# Patient Record
Sex: Female | Born: 1980 | Hispanic: No | Marital: Married | Smoking: Never smoker
Health system: Southern US, Community
[De-identification: ages and names within clinical notes are randomized; demographics above are authoritative.]

## PROBLEM LIST (undated history)

## (undated) DIAGNOSIS — E079 Disorder of thyroid, unspecified: Secondary | ICD-10-CM

---

## 2020-09-30 ENCOUNTER — Inpatient Hospital Stay (HOSPITAL_COMMUNITY)
Admission: AD | Admit: 2020-09-30 | Discharge: 2020-09-30 | Disposition: A | Discharge status: Home / Self Care | Payer: Medicaid Other | Attending: Obstetrics and Gynecology | Admitting: Obstetrics and Gynecology

## 2020-09-30 ENCOUNTER — Other Ambulatory Visit: Payer: Self-pay

## 2020-09-30 ENCOUNTER — Inpatient Hospital Stay (HOSPITAL_COMMUNITY): Payer: Medicaid Other

## 2020-09-30 ENCOUNTER — Encounter (HOSPITAL_COMMUNITY): Payer: Self-pay | Admitting: Emergency Medicine

## 2020-09-30 DIAGNOSIS — R109 Unspecified abdominal pain: Secondary | ICD-10-CM

## 2020-09-30 DIAGNOSIS — O26891 Other specified pregnancy related conditions, first trimester: Secondary | ICD-10-CM | POA: Insufficient documentation

## 2020-09-30 DIAGNOSIS — O09521 Supervision of elderly multigravida, first trimester: Secondary | ICD-10-CM | POA: Insufficient documentation

## 2020-09-30 DIAGNOSIS — O26899 Other specified pregnancy related conditions, unspecified trimester: Secondary | ICD-10-CM

## 2020-09-30 DIAGNOSIS — Z3491 Encounter for supervision of normal pregnancy, unspecified, first trimester: Secondary | ICD-10-CM

## 2020-09-30 DIAGNOSIS — Z3A01 Less than 8 weeks gestation of pregnancy: Secondary | ICD-10-CM | POA: Insufficient documentation

## 2020-09-30 HISTORY — DX: Disorder of thyroid, unspecified: E07.9

## 2020-09-30 LAB — COMPREHENSIVE METABOLIC PANEL
ALT: 13 U/L (ref 0–44)
AST: 20 U/L (ref 15–41)
Albumin: 3.8 g/dL (ref 3.5–5.0)
Alkaline Phosphatase: 83 U/L (ref 38–126)
Anion gap: 9 (ref 5–15)
BUN: 8 mg/dL (ref 6–20)
CO2: 24 mmol/L (ref 22–32)
Calcium: 9 mg/dL (ref 8.9–10.3)
Chloride: 103 mmol/L (ref 98–111)
Creatinine, Ser: 0.52 mg/dL (ref 0.44–1.00)
GFR, Estimated: >60 mL/min (ref >60)
Glucose, Bld: 103 mg/dL — ABNORMAL HIGH (ref 70–99)
Potassium: 3.3 mmol/L — ABNORMAL LOW (ref 3.5–5.1)
Sodium: 136 mmol/L (ref 135–145)
Total Bilirubin: 0.3 mg/dL (ref 0.3–1.2)
Total Protein: 7.3 g/dL (ref 6.5–8.1)

## 2020-09-30 LAB — LIPASE, BLOOD: Lipase: 31 U/L (ref 11–51)

## 2020-09-30 LAB — URINALYSIS, ROUTINE W REFLEX MICROSCOPIC
Bilirubin Urine: NEGATIVE
Glucose, UA: NEGATIVE mg/dL
Ketones, ur: NEGATIVE mg/dL
Leukocytes,Ua: NEGATIVE
Nitrite: NEGATIVE
Protein, ur: NEGATIVE mg/dL
Specific Gravity, Urine: 1.014 (ref 1.005–1.030)
pH: 5 (ref 5.0–8.0)

## 2020-09-30 LAB — CBC
HCT: 37.1 % (ref 36.0–46.0)
Hemoglobin: 12 g/dL (ref 12.0–15.0)
MCH: 29.3 pg (ref 26.0–34.0)
MCHC: 32.3 g/dL (ref 30.0–36.0)
MCV: 90.5 fL (ref 80.0–100.0)
Platelets: 302 10*3/uL (ref 150–400)
RBC: 4.1 MIL/uL (ref 3.87–5.11)
RDW: 12.4 % (ref 11.5–15.5)
WBC: 5.7 10*3/uL (ref 4.0–10.5)
nRBC: 0 % (ref 0.0–0.2)

## 2020-09-30 LAB — I-STAT BETA HCG BLOOD, ED (MC, WL, AP ONLY): I-stat hCG, quantitative: >2000 m[IU]/mL — ABNORMAL HIGH (ref <5)

## 2020-09-30 LAB — HCG, QUANTITATIVE, PREGNANCY: hCG, Beta Chain, Quant, S: 72311 m[IU]/mL — ABNORMAL HIGH (ref <5)

## 2020-09-30 NOTE — Discharge Instructions (Signed)
Safe Medications in Pregnancy    Acne: Benzoyl Peroxide Salicylic Acid  Backache/Headache: Tylenol: 2 regular strength every 4 hours OR              2 Extra strength every 6 hours  Colds/Coughs/Allergies: Benadryl (alcohol free) 25 mg every 6 hours as needed Breath right strips Claritin Cepacol throat lozenges Chloraseptic throat spray Cold-Eeze- up to three times per day Cough drops, alcohol free Flonase (by prescription only) Guaifenesin Mucinex Robitussin DM (plain only, alcohol free) Saline nasal spray/drops Sudafed (pseudoephedrine) & Actifed ** use only after [redacted] weeks gestation and if you do not have high blood pressure Tylenol Vicks Vaporub Zinc lozenges Zyrtec   Constipation: Colace Ducolax suppositories Fleet enema Glycerin suppositories Metamucil Milk of magnesia Miralax Senokot Smooth move tea  Diarrhea: Kaopectate Imodium A-D  *NO pepto Bismol  Hemorrhoids: Anusol Anusol HC Preparation H Tucks  Indigestion: Tums Maalox Mylanta Zantac  Pepcid  Insomnia: Benadryl (alcohol free) 25mg every 6 hours as needed Tylenol PM Unisom, no Gelcaps  Leg Cramps: Tums MagGel  Nausea/Vomiting:  Bonine Dramamine Emetrol Ginger extract Sea bands Meclizine  Nausea medication to take during pregnancy:  Unisom (doxylamine succinate 25 mg tablets) Take one tablet daily at bedtime. If symptoms are not adequately controlled, the dose can be increased to a maximum recommended dose of two tablets daily (1/2 tablet in the morning, 1/2 tablet mid-afternoon and one at bedtime). Vitamin B6 100mg tablets. Take one tablet twice a day (up to 200 mg per day).  Skin Rashes: Aveeno products Benadryl cream or 25mg every 6 hours as needed Calamine Lotion 1% cortisone cream  Yeast infection: Gyne-lotrimin 7 Monistat 7   **If taking multiple medications, please check labels to avoid duplicating the same active ingredients **take  medication as directed on the label ** Do not exceed 4000 mg of tylenol in 24 hours **Do not take medications that contain aspirin or ibuprofen   Prenatal Care Providers           [facility]  [address] [phone]  [facility]   [address]  [phone]  [facility] [place]       [address] [phone]            [facility] [place]     [address] [phone]          [facility] [place]   [address] [phone]  [facility]  [address] [phone]     [facility]  [address]   [phone]     [facility]  Phone: [phone]  [facility]  Phone: [phone]  [facility] Phone: [phone]  [facility] Phone: [phone]  [facility] Phone: [phone]  [facility] Phone: [phone]  [facility]  Phone: [phone]  

## 2020-09-30 NOTE — Progress Notes (Signed)
Pt refused wet prep and G/C swab.

## 2020-09-30 NOTE — MAU Provider Note (Signed)
History     CSN: [id number]  Arrival date and time: 09/30/20 4585   Event Date/Time   First Provider Initiated Contact with Patient 09/30/20 2208      Chief Complaint  Patient presents with  . Abdominal Pain   HPI Christine Page is a 40 y.o. G3P2000 at [redacted]w[redacted]d who presents from [facility] for abdominal pain. She reports it has been going on for months but has gotten worse since she got pregnant. She reports the pain is mostly on the right but is also on the left. She reports it comes and goes. She rates the pain a 6/10 and has not tried anything for the pain. She denies any vaginal bleeding or discharge.   OB History    Gravida  3   Para  2   Term  2   Preterm      AB      Living        SAB      IAB      Ectopic      Multiple      Live Births              Past Medical History:  Diagnosis Date  . Thyroid disease     Past Surgical History:  Procedure Laterality Date  . CESAREAN SECTION      No family history on file.  Social History   Tobacco Use  . Smoking status: Never Smoker  . Smokeless tobacco: Never Used  Substance Use Topics  . Alcohol use: Never  . Drug use: Never    Allergies: No Known Allergies  No medications prior to admission.    Review of Systems  Constitutional: Negative.  Negative for fatigue and fever.  HENT: Negative.   Respiratory: Negative.  Negative for shortness of breath.   Cardiovascular: Negative.  Negative for chest pain.  Gastrointestinal: Positive for abdominal pain. Negative for constipation, diarrhea, nausea and vomiting.  Genitourinary: Negative.  Negative for dysuria, vaginal bleeding and vaginal discharge.  Neurological: Negative.  Negative for dizziness and headaches.   Physical Exam   Blood pressure 120/74, pulse 75, temperature 98.6 F (37 C), resp. rate 16, height 5' 1.42" (1.56 m), last menstrual period 08/15/2020, SpO2 100 %.  Physical Exam Vitals and nursing note reviewed.   Constitutional:      General: She is not in acute distress.    Appearance: She is well-developed.  HENT:     Head: Normocephalic.  Eyes:     Pupils: Pupils are equal, round, and reactive to light.  Cardiovascular:     Rate and Rhythm: Normal rate and regular rhythm.     Heart sounds: Normal heart sounds.  Pulmonary:     Effort: Pulmonary effort is normal. No respiratory distress.     Breath sounds: Normal breath sounds.  Abdominal:     General: Bowel sounds are normal. There is no distension.     Palpations: Abdomen is soft.     Tenderness: There is no abdominal tenderness.  Skin:    General: Skin is warm and dry.  Neurological:     Mental Status: She is alert and oriented to person, place, and time.  Psychiatric:        Behavior: Behavior normal.        Thought Content: Thought content normal.        Judgment: Judgment normal.     MAU Course  Procedures Results for orders placed or performed during the hospital encounter of  09/30/20 (from the past 24 hour(s))  Lipase, blood     Status: None   Collection Time: 09/30/20  8:27 PM  Result Value Ref Range   Lipase 31 11 - 51 U/L  Comprehensive metabolic panel     Status: Abnormal   Collection Time: 09/30/20  8:27 PM  Result Value Ref Range   Sodium 136 135 - 145 mmol/L   Potassium 3.3 (L) 3.5 - 5.1 mmol/L   Chloride 103 98 - 111 mmol/L   CO2 24 22 - 32 mmol/L   Glucose, Bld 103 (H) 70 - 99 mg/dL   BUN 8 6 - 20 mg/dL   Creatinine, Ser 4.74 0.44 - 1.00 mg/dL   Calcium 9.0 8.9 - 25.9 mg/dL   Total Protein 7.3 6.5 - 8.1 g/dL   Albumin 3.8 3.5 - 5.0 g/dL   AST 20 15 - 41 U/L   ALT 13 0 - 44 U/L   Alkaline Phosphatase 83 38 - 126 U/L   Total Bilirubin 0.3 0.3 - 1.2 mg/dL   GFR, Estimated >56 >38 mL/min   Anion gap 9 5 - 15  CBC     Status: None   Collection Time: 09/30/20  8:27 PM  Result Value Ref Range   WBC 5.7 4.0 - 10.5 K/uL   RBC 4.10 3.87 - 5.11 MIL/uL   Hemoglobin 12.0 12.0 - 15.0 g/dL   HCT 75.6 43.3 -  29.5 %   MCV 90.5 80.0 - 100.0 fL   MCH 29.3 26.0 - 34.0 pg   MCHC 32.3 30.0 - 36.0 g/dL   RDW 18.8 41.6 - 60.6 %   Platelets 302 150 - 400 K/uL   nRBC 0.0 0.0 - 0.2 %  Urinalysis, Routine w reflex microscopic Urine, Clean Catch     Status: Abnormal   Collection Time: 09/30/20  8:27 PM  Result Value Ref Range   Color, Urine YELLOW YELLOW   APPearance HAZY (A) CLEAR   Specific Gravity, Urine 1.014 1.005 - 1.030   pH 5.0 5.0 - 8.0   Glucose, UA NEGATIVE NEGATIVE mg/dL   Hgb urine dipstick SMALL (A) NEGATIVE   Bilirubin Urine NEGATIVE NEGATIVE   Ketones, ur NEGATIVE NEGATIVE mg/dL   Protein, ur NEGATIVE NEGATIVE mg/dL   Nitrite NEGATIVE NEGATIVE   Leukocytes,Ua NEGATIVE NEGATIVE   RBC / HPF 0-5 0 - 5 RBC/hpf   WBC, UA 0-5 0 - 5 WBC/hpf   Bacteria, UA RARE (A) NONE SEEN   Squamous Epithelial / LPF 11-20 0 - 5   Mucus PRESENT   I-Stat beta hCG blood, ED     Status: Abnormal   Collection Time: 09/30/20  8:57 PM  Result Value Ref Range   I-stat hCG, quantitative >2,000.0 (H) <5 mIU/mL   Comment 3          hCG, quantitative, pregnancy     Status: Abnormal   Collection Time: 09/30/20 10:06 PM  Result Value Ref Range   hCG, Beta Chain, Quant, S 72,311 (H) <5 mIU/mL  ABO/Rh     Status: None   Collection Time: 09/30/20 10:06 PM  Result Value Ref Range   ABO/RH(D)      AB POS Performed at [facility], [address] [place], [place] [address]    US OB LESS THAN 14 WEEKS WITH OB TRANSVAGINAL  Result Date: 09/30/2020 CLINICAL DATA:  Abdominal pain EXAM: OBSTETRIC <14 WK [place] AND TRANSVAGINAL OB US TECHNIQUE: Both transabdominal and transvaginal ultrasound examinations were performed for  complete evaluation of the gestation as well as the maternal uterus, adnexal regions, and pelvic cul-de-sac. Transvaginal technique was performed to assess early pregnancy. COMPARISON:  None. FINDINGS: Intrauterine gestational sac: Single Yolk sac:  Visualized. Embryo:  Visualized. Cardiac  Activity: Visualized. Heart Rate:   bpm MSD:   mm    w     d CRL:  2.2 mm   too small to date Subchorionic hemorrhage:  None visualized. Maternal uterus/adnexae: No adnexal mass or free fluid. IMPRESSION: Early intrauterine gestation with small fetal pole, too small to date at this time. This could be followed with repeat ultrasound in 14 days to ensure expected progression. No acute maternal findings. Electronically Signed   By: Charlett Nose M.D.   On: 09/30/2020 22:56   MDM UA, UPT CBC, HCG ABO/Rh- AB Pos Wet prep and gc/chlamydia- patient declined US OB Comp Less 14 weeks with Transvaginal  Assessment and Plan   1. Normal intrauterine pregnancy on prenatal ultrasound in first trimester   2. Abdominal pain affecting pregnancy   3. [redacted] weeks gestation of pregnancy    -Discharge home in stable condition -Repeat ultrasound ordered for 2 weeks -First trimester precautions discussed -Patient advised to follow-up with OB to establish prenatal care -Patient may return to MAU as needed or if her condition were to change or worsen   Rolm Bookbinder CNM 09/30/2020, 10:08 PM

## 2020-09-30 NOTE — ED Provider Notes (Signed)
Emergency Medicine Provider Triage Evaluation Note  Christine Page , a 40 y.o. female  was evaluated in triage.  Pt complains of diffuse lower abdominal pain for the past 3 months however more consistent over the past 2 days. She reports LNMP 04/16 and she had a positive pregnancy test at an UC. She does not have a copy of those results. She also complains of nausea; no vomiting. No urinary symptoms. No vaginal bleeding or discharge. She has 2 children at home; delivery by c section. No complications. No fevers or chills.   Review of Systems  Positive: + abdominal pain,  nausea Negative: - fevers, chills, diarrhea  Physical Exam  BP 120/74 (BP Location: Right Arm)   Pulse 75   Temp 98.6 F (37 C)   Resp 16   Ht 5' 1.42" (1.56 m)   LMP 08/15/2020   SpO2 100%  Gen:   Awake, no distress   Resp:  Normal effort  MSK:   Moves extremities without difficulty  Other:  + RLQ and LLQ abdominal TTP  Medical Decision Making  Medically screening exam initiated at 8:39 PM.  Appropriate orders placed.  Christine Page was informed that the remainder of the evaluation will be completed by another provider, this initial triage assessment does not replace that evaluation, and the importance of remaining in the ED until their evaluation is complete.  Pregnancy test positive. Discussed with MAU provider Christine Page who agrees to accept.    Tanda Rockers, PA-C 09/30/20 2112    Mancel Bale, MD 09/30/20 [phone]

## 2020-09-30 NOTE — ED Triage Notes (Signed)
Pt reports RLQ pain that has been going on for 3 months and has gotten worse the last two days. Pt reports nausea, denies v/d. Pt is [redacted] weeks pregnant.

## 2020-09-30 NOTE — MAU Note (Signed)
Pt reports lower right and left sided pain, 7/10, hurts worse when walking or changing positions. Right side has been hurting for about 2 months and left side started 1 month ago, Right side hurts worse. Denies VB.

## 2020-10-01 LAB — ABO/RH: ABO/RH(D): AB POS

## 2020-10-14 ENCOUNTER — Ambulatory Visit
Admission: RE | Admit: 2020-10-14 | Discharge: 2020-10-14 | Disposition: A | Discharge status: Home / Self Care | Payer: Medicaid Other | Source: Ambulatory Visit

## 2020-10-14 ENCOUNTER — Ambulatory Visit (INDEPENDENT_AMBULATORY_CARE_PROVIDER_SITE_OTHER): Payer: Medicaid Other | Admitting: *Deleted

## 2020-10-14 ENCOUNTER — Other Ambulatory Visit: Payer: Self-pay

## 2020-10-14 VITALS — BP 103/67 | HR 59 | Ht 62.5 in | Wt 156.6 lb

## 2020-10-14 DIAGNOSIS — O3680X0 Pregnancy with inconclusive fetal viability, not applicable or unspecified: Secondary | ICD-10-CM

## 2020-10-14 DIAGNOSIS — Z3491 Encounter for supervision of normal pregnancy, unspecified, first trimester: Secondary | ICD-10-CM | POA: Diagnosis not present

## 2020-10-14 DIAGNOSIS — Z3A01 Less than 8 weeks gestation of pregnancy: Secondary | ICD-10-CM | POA: Insufficient documentation

## 2020-10-14 NOTE — Progress Notes (Signed)
Chart reviewed for nurse visit. Agree with plan of care.   Venora Maples, MD 10/14/20 2:27 PM

## 2020-10-14 NOTE — Progress Notes (Signed)
Here for [place] results. Reviewed with Dr. Crissie Reese and advised US shows live baby measuring  [redacted]w[redacted]d with EDD of 05/27/21 which is different than EDD from period. We discussed is noted LMP 4/ 29 or 4/15. She states around 08/15/20 within a few days. Explained MD states EDD should be based on [place] not LMP. Advised to start prenatal care with provider of her choice. List placed in AVS. She states she goes to [facility] and they will assign prenatal provider. Advised to start PNV. Advised if severe pain to go to hospital for evaluation . She voices understanding. Juanito Gonyer,RN

## 2020-10-14 NOTE — Patient Instructions (Signed)
Prenatal Care Providers           [facility]  [address] [phone]  [facility]   [address]  [phone]  [facility]       [address] [phone]            [facility] [place]     [address] [phone]          [facility] [place]   [address] [phone]  [facility]  [address] [phone]     [facility]  [address]   [phone]     [facility]  Phone: [phone]  [facility]  Phone: [phone]  [facility] Phone: [phone]  [facility] Phone: [phone]  [facility] Phone: [phone]  [facility] Phone: [phone]  [facility]  Phone: [phone]  

## 2020-12-25 ENCOUNTER — Ambulatory Visit (INDEPENDENT_AMBULATORY_CARE_PROVIDER_SITE_OTHER): Payer: Medicaid Other | Admitting: Obstetrics and Gynecology

## 2020-12-25 ENCOUNTER — Other Ambulatory Visit: Payer: Self-pay

## 2020-12-25 ENCOUNTER — Encounter: Payer: Self-pay | Admitting: Obstetrics and Gynecology

## 2020-12-25 ENCOUNTER — Other Ambulatory Visit (HOSPITAL_COMMUNITY)
Admission: RE | Admit: 2020-12-25 | Discharge: 2020-12-25 | Disposition: A | Discharge status: Home / Self Care | Payer: Medicaid Other | Source: Ambulatory Visit | Attending: Obstetrics and Gynecology | Admitting: Obstetrics and Gynecology

## 2020-12-25 DIAGNOSIS — O0990 Supervision of high risk pregnancy, unspecified, unspecified trimester: Secondary | ICD-10-CM | POA: Diagnosis present

## 2020-12-25 DIAGNOSIS — N841 Polyp of cervix uteri: Secondary | ICD-10-CM | POA: Insufficient documentation

## 2020-12-25 DIAGNOSIS — Z3A18 18 weeks gestation of pregnancy: Secondary | ICD-10-CM | POA: Insufficient documentation

## 2020-12-25 DIAGNOSIS — Z3480 Encounter for supervision of other normal pregnancy, unspecified trimester: Secondary | ICD-10-CM

## 2020-12-25 DIAGNOSIS — O09522 Supervision of elderly multigravida, second trimester: Secondary | ICD-10-CM | POA: Insufficient documentation

## 2020-12-25 DIAGNOSIS — Z98891 History of uterine scar from previous surgery: Secondary | ICD-10-CM | POA: Insufficient documentation

## 2020-12-25 LAB — OB RESULTS CONSOLE GC/CHLAMYDIA: Gonorrhea: NEGATIVE

## 2020-12-25 MED ORDER — BLOOD PRESSURE KIT DEVI: 1.0000 | 0 refills | Status: DC | PRN

## 2020-12-25 NOTE — Progress Notes (Signed)
INITIAL PRENATAL VISIT NOTE  Subjective:  Christine Page is a 40 y.o. P3A2505 at [redacted]w[redacted]d by ultrasound being seen today for her initial prenatal visit. She has an obstetric history significant for c section x 2. She has a medical history significant for thyroid disease and goiter.  Patient reports no complaints.  Contractions: Not present. Vag. Bleeding: None.  Movement: Present. Denies leaking of fluid.    Past Medical History:  Diagnosis Date   Thyroid disease     Past Surgical History:  Procedure Laterality Date   CESAREAN SECTION      OB History  Gravida Para Term Preterm AB Living  4 2 2   1 2   SAB IAB Ectopic Multiple Live Births  1       2    # Outcome Date GA Lbr Len/2nd Weight Sex Delivery Anes PTL Lv  4 Current           3 Term 10/04/18 [redacted]w[redacted]d   F CS-LTranv Spinal N LIV     Complications: Chorioamnionitis, Failure to Progress in First Stage  2 Term 08/21/14 [redacted]w[redacted]d  8 lb 2.5 oz (3.7 kg) M CS-LTranv Spinal N LIV     Complications: Fetal Intolerance  1 SAB             Social History   Socioeconomic History   Marital status: Single    Spouse name: Not on file   Number of children: Not on file   Years of education: Not on file   Highest education level: Not on file  Occupational History   Not on file  Tobacco Use   Smoking status: Never   Smokeless tobacco: Never  Substance and Sexual Activity   Alcohol use: Never   Drug use: Never   Sexual activity: Not on file  Other Topics Concern   Not on file  Social History Narrative   Not on file   Social Determinants of Health   Financial Resource Strain: Not on file  Food Insecurity: Not on file  Transportation Needs: Not on file  Physical Activity: Not on file  Stress: Not on file  Social Connections: Not on file    No family history on file.  No current outpatient medications on file.  No Known Allergies  Review of Systems: Negative except for what is mentioned in HPI.  Objective:    Vitals:   12/25/20 1016  BP: 97/60  Pulse: 79  Weight: 152 lb (68.9 kg)    Fetal Status: Fetal Heart Rate (bpm): 153   Movement: Present     Physical Exam: BP 97/60   Pulse 79   Wt 152 lb (68.9 kg)   LMP 08/15/2020 (Within Days)   BMI 27.36 kg/m  CONSTITUTIONAL: Well-developed, well-nourished female in no acute distress.  NEUROLOGIC: Alert and oriented to person, place, and time. Normal reflexes, muscle tone coordination. No cranial nerve deficit noted. PSYCHIATRIC: Normal mood and affect. Normal behavior. Normal judgment and thought content. SKIN: Skin is warm and dry. No rash noted. Not diaphoretic. No erythema. No pallor. HENT:  Normocephalic, atraumatic, External right and left ear normal. Oropharynx is clear and moist EYES: Conjunctivae and EOM are normal. Pt has a cystic lesion near her right iris NECK: Normal range of motion, supple, no masses CARDIOVASCULAR: Normal heart rate noted, regular rhythm RESPIRATORY: Effort and breath sounds normal, no problems with respiration noted BREASTS: symmetric, non-tender, no masses palpable, exam performed with chaperone present ABDOMEN: Soft, nontender, nondistended, gravid. GU: normal appearing  external female genitalia, nulliparous appearing cervix with cervical polyp noted through the os, scant white discharge in vagina, no lesions noted Bimanual: 18 weeks sized uterus, no adnexal tenderness or palpable lesions noted MUSCULOSKELETAL: Normal range of motion. EXT:  No edema and no tenderness. 2+ distal pulses.   Assessment and Plan:  Pregnancy: [id number] at [redacted]w[redacted]d by ultrasound  1. Supervision of high risk pregnancy, antepartum Continue fouritne care, anatomy scan ordered - Cytology - PAP - Culture, OB Urine - CBC/D/Plt+RPR+Rh+ABO+RubIgG... - Cervicovaginal ancillary only( [place]) - AFP, Serum, Open Spina Bifida - Genetic Screening - CH[facility] AMB BABYSCRIPTS OPT IN - [place] MFM OB DETAIL +14 WK; Future  2. [redacted] weeks gestation  of pregnancy   3. Advanced maternal age in multigravida, second trimester   4. History of 2 cesarean sections Did not discuss delivery route at this visit  5. Cervical polyp    Preterm labor symptoms and general obstetric precautions including but not limited to vaginal bleeding, contractions, leaking of fluid and fetal movement were reviewed in detail with the patient.  Please refer to After Visit Summary for other counseling recommendations.   Return in about 4 weeks (around 01/22/2021) for [facility], in person.  Warden Fillers 12/25/2020 11:05 AM

## 2020-12-25 NOTE — Progress Notes (Signed)
Pt presents today for initial OB visit. PHQ 9: 4. Pt has no complaints today.

## 2020-12-26 LAB — CYTOLOGY - PAP
Comment: NEGATIVE
Diagnosis: NEGATIVE
High risk HPV: NEGATIVE

## 2020-12-26 LAB — CERVICOVAGINAL ANCILLARY ONLY
Chlamydia: NEGATIVE
Comment: NEGATIVE
Comment: NEGATIVE
Comment: NORMAL
Neisseria Gonorrhea: NEGATIVE
Trichomonas: NEGATIVE

## 2020-12-26 LAB — TSH: TSH: 5.83 u[IU]/mL — ABNORMAL HIGH (ref 0.450–4.500)

## 2020-12-27 LAB — CBC/D/PLT+RPR+RH+ABO+RUBIGG...
Antibody Screen: NEGATIVE
Basophils Absolute: 0 10*3/uL (ref 0.0–0.2)
Basos: 0 %
EOS (ABSOLUTE): 0 10*3/uL (ref 0.0–0.4)
Eos: 1 %
HCV Ab: <0.1 s/co ratio (ref 0.0–0.9)
HIV Screen 4th Generation wRfx: NONREACTIVE
Hematocrit: 34.3 % (ref 34.0–46.6)
Hemoglobin: 11.7 g/dL (ref 11.1–15.9)
Hepatitis B Surface Ag: NEGATIVE
Immature Grans (Abs): 0 10*3/uL (ref 0.0–0.1)
Immature Granulocytes: 0 %
Lymphocytes Absolute: 1.1 10*3/uL (ref 0.7–3.1)
Lymphs: 30 %
MCH: 30.4 pg (ref 26.6–33.0)
MCHC: 34.1 g/dL (ref 31.5–35.7)
MCV: 89 fL (ref 79–97)
Monocytes Absolute: 0.3 10*3/uL (ref 0.1–0.9)
Monocytes: 7 %
Neutrophils Absolute: 2.4 10*3/uL (ref 1.4–7.0)
Neutrophils: 62 %
Platelets: 266 10*3/uL (ref 150–450)
RBC: 3.85 x10E6/uL (ref 3.77–5.28)
RDW: 13.1 % (ref 11.7–15.4)
RPR Ser Ql: NONREACTIVE
Rh Factor: POSITIVE
Rubella Antibodies, IGG: 12 index (ref >0.99)
WBC: 3.7 10*3/uL (ref 3.4–10.8)

## 2020-12-27 LAB — AFP, SERUM, OPEN SPINA BIFIDA
AFP MoM: 1.5
AFP Value: 68.4 ng/mL
Gest. Age on Collection Date: 18.1 weeks
Maternal Age At EDD: 40.6 yr
OSBR Risk 1 IN: 2734
Test Results:: NEGATIVE
Weight: 152 [lb_av]

## 2020-12-27 LAB — HCV INTERPRETATION

## 2020-12-29 ENCOUNTER — Other Ambulatory Visit: Payer: Self-pay | Admitting: Obstetrics and Gynecology

## 2020-12-29 LAB — CULTURE, OB URINE

## 2020-12-29 LAB — URINE CULTURE, OB REFLEX

## 2020-12-29 MED ORDER — NITROFURANTOIN MONOHYD MACRO 100 MG PO CAPS: 100.0000 mg | ORAL_CAPSULE | Freq: Two times a day (BID) | ORAL | 1 refills | Status: DC

## 2020-12-30 NOTE — Progress Notes (Signed)
Called patient using [employer]. Patient notified of concern for hypothyroidism and need for lab appointment to check T4. Patient requests call to schedule appointment for lab only visit. Please use the interpreter service.

## 2021-01-01 ENCOUNTER — Encounter: Payer: Self-pay | Admitting: Obstetrics and Gynecology

## 2021-01-06 ENCOUNTER — Encounter: Payer: Self-pay | Admitting: Obstetrics and Gynecology

## 2021-01-08 ENCOUNTER — Other Ambulatory Visit: Payer: Self-pay

## 2021-01-08 ENCOUNTER — Other Ambulatory Visit: Payer: Medicaid Other

## 2021-01-08 DIAGNOSIS — O0990 Supervision of high risk pregnancy, unspecified, unspecified trimester: Secondary | ICD-10-CM

## 2021-01-09 LAB — T4, FREE: Free T4: 0.7 ng/dL — ABNORMAL LOW (ref 0.82–1.77)

## 2021-01-14 ENCOUNTER — Other Ambulatory Visit: Payer: Self-pay | Admitting: *Deleted

## 2021-01-14 ENCOUNTER — Encounter: Payer: Self-pay | Admitting: *Deleted

## 2021-01-14 DIAGNOSIS — E039 Hypothyroidism, unspecified: Secondary | ICD-10-CM

## 2021-01-14 MED ORDER — PREPLUS 27-1 MG PO TABS: 1.0000 | ORAL_TABLET | Freq: Every day | ORAL | 13 refills | Status: DC

## 2021-01-14 MED ORDER — LEVOTHYROXINE SODIUM 112 MCG PO TABS: 112.0000 ug | ORAL_TABLET | Freq: Every day | ORAL | 6 refills | Status: DC

## 2021-01-14 NOTE — Progress Notes (Signed)
TC to patient. Notified of test results and recommendation for Synthroid. RX for Synthroid sent per verbal order Dr. Donavan Foil. Educational information sent to [employer].  Patient also requested PNV. RX sent.

## 2021-01-22 ENCOUNTER — Ambulatory Visit: Payer: Medicaid Other | Admitting: *Deleted

## 2021-01-22 ENCOUNTER — Encounter: Payer: Self-pay | Admitting: Obstetrics & Gynecology

## 2021-01-22 ENCOUNTER — Encounter: Payer: Self-pay | Admitting: *Deleted

## 2021-01-22 ENCOUNTER — Other Ambulatory Visit: Payer: Self-pay

## 2021-01-22 ENCOUNTER — Ambulatory Visit (INDEPENDENT_AMBULATORY_CARE_PROVIDER_SITE_OTHER): Payer: Medicaid Other | Admitting: Obstetrics & Gynecology

## 2021-01-22 ENCOUNTER — Ambulatory Visit: Payer: Medicaid Other | Attending: Obstetrics and Gynecology

## 2021-01-22 VITALS — BP 102/69 | HR 74 | Wt 153.0 lb

## 2021-01-22 VITALS — BP 94/53 | HR 85

## 2021-01-22 DIAGNOSIS — Z98891 History of uterine scar from previous surgery: Secondary | ICD-10-CM

## 2021-01-22 DIAGNOSIS — O0990 Supervision of high risk pregnancy, unspecified, unspecified trimester: Secondary | ICD-10-CM | POA: Insufficient documentation

## 2021-01-22 NOTE — Progress Notes (Signed)
   PRENATAL VISIT NOTE  Subjective:  Christine Page is a 40 y.o. [phone] at [redacted]w[redacted]d being seen today for ongoing prenatal care.  She is currently monitored for the following issues for this high-risk pregnancy and has Supervision of high risk pregnancy, antepartum; Advanced maternal age in multigravida, second trimester; History of 2 cesarean sections; and Cervical polyp on their problem list.  Patient reports no complaints.  Contractions: Not present.  .  Movement: Present. Denies leaking of fluid.   The following portions of the patient's history were reviewed and updated as appropriate: allergies, current medications, past family history, past medical history, past social history, past surgical history and problem list.   Objective:   Vitals:   01/22/21 1533  BP: 102/69  Pulse: 74  Weight: 153 lb (69.4 kg)    Fetal Status: Fetal Heart Rate (bpm): 152   Movement: Present     General:  Alert, oriented and cooperative. Patient is in no acute distress.  Skin: Skin is warm and dry. No rash noted.   Cardiovascular: Normal heart rate noted  Respiratory: Normal respiratory effort, no problems with respiration noted  Abdomen: Soft, gravid, appropriate for gestational age.  Pain/Pressure: Absent     Pelvic: Cervical exam deferred        Extremities: Normal range of motion.  Edema: None  Mental Status: Normal mood and affect. Normal behavior. Normal judgment and thought content.   Assessment and Plan:  Pregnancy: [id number] at [redacted]w[redacted]d 1. Supervision of high risk pregnancy, antepartum Recommended Covid Vaccine in pregnancy  2. History of 2 cesarean sections Plan repeat 39 weeks  Preterm labor symptoms and general obstetric precautions including but not limited to vaginal bleeding, contractions, leaking of fluid and fetal movement were reviewed in detail with the patient. Please refer to After Visit Summary for other counseling recommendations.   Return in about 5 weeks (around  02/26/2021).  Future Appointments  Date Time Provider Department Center  03/05/2021  2:30 PM [facility] NURSE [facility]  03/05/2021  2:45 PM [facility] US5 [facility]    Scheryl Darter, MD

## 2021-01-22 NOTE — Progress Notes (Signed)
ROB [redacted]w[redacted]d  CC: None

## 2021-01-23 ENCOUNTER — Other Ambulatory Visit: Payer: Self-pay | Admitting: *Deleted

## 2021-01-23 DIAGNOSIS — O09523 Supervision of elderly multigravida, third trimester: Secondary | ICD-10-CM

## 2021-02-26 ENCOUNTER — Other Ambulatory Visit: Payer: Medicaid Other

## 2021-02-26 ENCOUNTER — Encounter: Payer: Self-pay | Admitting: Obstetrics and Gynecology

## 2021-02-26 ENCOUNTER — Ambulatory Visit (INDEPENDENT_AMBULATORY_CARE_PROVIDER_SITE_OTHER): Payer: Medicaid Other | Admitting: Obstetrics and Gynecology

## 2021-02-26 ENCOUNTER — Other Ambulatory Visit: Payer: Self-pay

## 2021-02-26 VITALS — BP 96/52 | HR 70 | Wt 156.1 lb

## 2021-02-26 DIAGNOSIS — Z98891 History of uterine scar from previous surgery: Secondary | ICD-10-CM

## 2021-02-26 DIAGNOSIS — Z3A27 27 weeks gestation of pregnancy: Secondary | ICD-10-CM

## 2021-02-26 DIAGNOSIS — O09522 Supervision of elderly multigravida, second trimester: Secondary | ICD-10-CM

## 2021-02-26 DIAGNOSIS — O0990 Supervision of high risk pregnancy, unspecified, unspecified trimester: Secondary | ICD-10-CM

## 2021-02-26 NOTE — Progress Notes (Signed)
Pt presents for ROB and 2 gtt labs.  Pt declines Tdap, Flu, and Covid vaccines. GAD7=0 PHQ9= 0

## 2021-02-26 NOTE — Progress Notes (Signed)
   PRENATAL VISIT NOTE  Subjective:  Christine Page is a 40 y.o. [phone] at [redacted]w[redacted]d being seen today for ongoing prenatal care.  She is currently monitored for the following issues for this high-risk pregnancy and has Supervision of high risk pregnancy, antepartum; Advanced maternal age in multigravida, second trimester; History of 2 cesarean sections; and Cervical polyp on their problem list.  Patient reports no complaints.  Contractions: Not present. Vag. Bleeding: None.  Movement: Present. Denies leaking of fluid.   The following portions of the patient's history were reviewed and updated as appropriate: allergies, current medications, past family history, past medical history, past social history, past surgical history and problem list.   Objective:   Vitals:   02/26/21 0830  BP: (!) 96/52  Pulse: 70  Weight: 156 lb 1.6 oz (70.8 kg)    Fetal Status: Fetal Heart Rate (bpm): 149   Movement: Present     General:  Alert, oriented and cooperative. Patient is in no acute distress.  Skin: Skin is warm and dry. No rash noted.   Cardiovascular: Normal heart rate noted  Respiratory: Normal respiratory effort, no problems with respiration noted  Abdomen: Soft, gravid, appropriate for gestational age.  Pain/Pressure: Absent     Pelvic: Cervical exam deferred        Extremities: Normal range of motion.  Edema: None  Mental Status: Normal mood and affect. Normal behavior. Normal judgment and thought content.   Assessment and Plan:  Pregnancy: P6P9509 at [redacted]w[redacted]d 1. Supervision of high risk pregnancy, antepartum Patient is doing well without complaints Third trimester labs today Patient declined flu and tdap vaccines  2. [redacted] weeks gestation of pregnancy Patient undecided on contraception  3. History of 2 cesarean sections Will be scheduled for a repeat  4. Advanced maternal age in multigravida, second trimester Follow up growth ultrasound scheduled  Preterm labor symptoms and  general obstetric precautions including but not limited to vaginal bleeding, contractions, leaking of fluid and fetal movement were reviewed in detail with the patient. Please refer to After Visit Summary for other counseling recommendations.   Return in about 2 weeks (around 03/12/2021) for in person, ROB, High risk.  Future Appointments  Date Time Provider Department Center  02/26/2021  8:55 AM Theo Reither, Gigi Gin, MD [facility] None  03/05/2021  2:30 PM [facility] NURSE [facility]  03/05/2021  2:45 PM [facility] US5 [facility]    Catalina Antigua, MD

## 2021-02-27 LAB — GLUCOSE TOLERANCE, 2 HOURS W/ 1HR
Glucose, 1 hour: 146 mg/dL (ref 70–179)
Glucose, 2 hour: 95 mg/dL (ref 70–152)
Glucose, Fasting: 82 mg/dL (ref 70–91)

## 2021-02-27 LAB — CBC
Hematocrit: 35 % (ref 34.0–46.6)
Hemoglobin: 11.9 g/dL (ref 11.1–15.9)
MCH: 31 pg (ref 26.6–33.0)
MCHC: 34 g/dL (ref 31.5–35.7)
MCV: 91 fL (ref 79–97)
Platelets: 212 10*3/uL (ref 150–450)
RBC: 3.84 x10E6/uL (ref 3.77–5.28)
RDW: 12.5 % (ref 11.7–15.4)
WBC: 3.6 10*3/uL (ref 3.4–10.8)

## 2021-02-27 LAB — THYROID PANEL WITH TSH
Free Thyroxine Index: 1.7 (ref 1.2–4.9)
T3 Uptake Ratio: 15 % — ABNORMAL LOW (ref 24–39)
T4, Total: 11.3 ug/dL (ref 4.5–12.0)
TSH: 3.94 u[IU]/mL (ref 0.450–4.500)

## 2021-02-27 LAB — HIV ANTIBODY (ROUTINE TESTING W REFLEX): HIV Screen 4th Generation wRfx: NONREACTIVE

## 2021-02-27 LAB — RPR: RPR Ser Ql: NONREACTIVE

## 2021-03-05 ENCOUNTER — Encounter: Payer: Self-pay | Admitting: *Deleted

## 2021-03-05 ENCOUNTER — Other Ambulatory Visit: Payer: Self-pay | Admitting: *Deleted

## 2021-03-05 ENCOUNTER — Other Ambulatory Visit: Payer: Self-pay

## 2021-03-05 ENCOUNTER — Ambulatory Visit (HOSPITAL_BASED_OUTPATIENT_CLINIC_OR_DEPARTMENT_OTHER): Payer: Medicaid Other | Admitting: Maternal & Fetal Medicine

## 2021-03-05 ENCOUNTER — Ambulatory Visit: Payer: Medicaid Other | Admitting: *Deleted

## 2021-03-05 ENCOUNTER — Ambulatory Visit: Payer: Medicaid Other | Attending: Obstetrics

## 2021-03-05 VITALS — BP 103/62 | HR 78

## 2021-03-05 DIAGNOSIS — O99283 Endocrine, nutritional and metabolic diseases complicating pregnancy, third trimester: Secondary | ICD-10-CM

## 2021-03-05 DIAGNOSIS — O0990 Supervision of high risk pregnancy, unspecified, unspecified trimester: Secondary | ICD-10-CM

## 2021-03-05 DIAGNOSIS — O34219 Maternal care for unspecified type scar from previous cesarean delivery: Secondary | ICD-10-CM

## 2021-03-05 DIAGNOSIS — E039 Hypothyroidism, unspecified: Secondary | ICD-10-CM | POA: Insufficient documentation

## 2021-03-05 DIAGNOSIS — O09523 Supervision of elderly multigravida, third trimester: Secondary | ICD-10-CM | POA: Diagnosis not present

## 2021-03-05 DIAGNOSIS — Z3A28 28 weeks gestation of pregnancy: Secondary | ICD-10-CM

## 2021-03-05 NOTE — Progress Notes (Signed)
MFM Brief Note  Ms. Christine Page is a 40 yo G4P2 who is here for follow up growth and consultation regarding hypothyroidism.  She is seen at the request of Dr. Mariel Aloe  Follow up growth due to AMA of 40 yo and hypothryodism on 112 mcg of synthroid. Normal interval growth with measurements consistent with dates Good fetal movement and amniotic fluid volume   Christine Page's TSH is elevated of 3.94 on 112 mcg of synthroid. Ideally her TSH should around < 2.5. Therefore I recommend increasing her synthroid and repeat labs in 4 weeks.  I messaged her practice Dr. Debroah Loop, Dr. Crissie Reese and Dr. Jolayne Panther ( most recent providers to see the patient) notifying them of the recommended increase in her medical therapy via EPIC  She is seen  All questions answered  I spent 20 minutes with > 50% in face to face consultation.  Novella Olive, MD

## 2021-03-07 ENCOUNTER — Other Ambulatory Visit: Payer: Self-pay | Admitting: Obstetrics and Gynecology

## 2021-03-07 DIAGNOSIS — E039 Hypothyroidism, unspecified: Secondary | ICD-10-CM

## 2021-03-07 MED ORDER — LEVOTHYROXINE SODIUM 137 MCG PO TABS: 137.0000 ug | ORAL_TABLET | Freq: Every day | ORAL | 1 refills | Status: DC

## 2021-03-11 ENCOUNTER — Ambulatory Visit (INDEPENDENT_AMBULATORY_CARE_PROVIDER_SITE_OTHER): Payer: Medicaid Other | Admitting: Obstetrics

## 2021-03-11 ENCOUNTER — Other Ambulatory Visit: Payer: Self-pay

## 2021-03-11 ENCOUNTER — Encounter: Payer: Self-pay | Admitting: Obstetrics

## 2021-03-11 VITALS — BP 94/61 | HR 83 | Wt 155.0 lb

## 2021-03-11 DIAGNOSIS — O09523 Supervision of elderly multigravida, third trimester: Secondary | ICD-10-CM

## 2021-03-11 DIAGNOSIS — G43001 Migraine without aura, not intractable, with status migrainosus: Secondary | ICD-10-CM

## 2021-03-11 MED ORDER — OXYCODONE-ACETAMINOPHEN 5-325 MG PO TABS: 1.0000 | ORAL_TABLET | ORAL | 0 refills | Status: DC | PRN

## 2021-03-11 NOTE — Progress Notes (Signed)
Subjective:  Christine Page is a 40 y.o. [phone] at [redacted]w[redacted]d being seen today for ongoing prenatal care.  She is currently monitored for the following issues for this low-risk pregnancy and has Supervision of high risk pregnancy, antepartum; Advanced maternal age in multigravida, second trimester; History of 2 cesarean sections; and Cervical polyp on their problem list.  Patient reports headache.  Contractions: Not present. Vag. Bleeding: None.  Movement: Present. Denies leaking of fluid.   The following portions of the patient's history were reviewed and updated as appropriate: allergies, current medications, past family history, past medical history, past social history, past surgical history and problem list. Problem list updated.  Objective:   Vitals:   03/11/21 1514  BP: 94/61  Pulse: 83  Weight: 155 lb (70.3 kg)    Fetal Status:     Movement: Present     General:  Alert, oriented and cooperative. Patient is in no acute distress.  Skin: Skin is warm and dry. No rash noted.   Cardiovascular: Normal heart rate noted  Respiratory: Normal respiratory effort, no problems with respiration noted  Abdomen: Soft, gravid, appropriate for gestational age. Pain/Pressure: Absent     Pelvic:  Cervical exam deferred        Extremities: Normal range of motion.  Edema: Trace  Mental Status: Normal mood and affect. Normal behavior. Normal judgment and thought content.   Urinalysis:      Assessment and Plan:  Pregnancy: [id number] at [redacted]w[redacted]d  1. Migraine without aura and with status migrainosus, not intractable Rx: - Lactate dehydrogenase - Creatinine, serum - CBC - ALT - AST - Protein / creatinine ratio, urine - oxyCODONE-acetaminophen (PERCOCET/ROXICET) 5-325 MG tablet; Take 1 tablet by mouth every 4 (four) hours as needed for severe pain.  Dispense: 20 tablet; Refill: 0  Preterm labor symptoms and general obstetric precautions including but not limited to vaginal bleeding,  contractions, leaking of fluid and fetal movement were reviewed in detail with the patient. Please refer to After Visit Summary for other counseling recommendations.   Return in about 2 weeks (around 03/25/2021) for ROB.   Brock Bad, MD  03/11/21

## 2021-03-11 NOTE — Progress Notes (Signed)
ROB 29 wks  Patient has a bad headache today. Has taken 2 doses of tylenol without relief. Denies dizziness, blurry vision, RUQ abdominal pain. Reports occ heartburn. Head hurts more when she moves her head.

## 2021-03-12 LAB — CBC
Hematocrit: 35.6 % (ref 34.0–46.6)
Hemoglobin: 11.8 g/dL (ref 11.1–15.9)
MCH: 30.4 pg (ref 26.6–33.0)
MCHC: 33.1 g/dL (ref 31.5–35.7)
MCV: 92 fL (ref 79–97)
Platelets: 218 10*3/uL (ref 150–450)
RBC: 3.88 x10E6/uL (ref 3.77–5.28)
RDW: 12.1 % (ref 11.7–15.4)
WBC: 5.4 10*3/uL (ref 3.4–10.8)

## 2021-03-12 LAB — AST: AST: 15 IU/L (ref 0–40)

## 2021-03-12 LAB — PROTEIN / CREATININE RATIO, URINE
Creatinine, Urine: 115.3 mg/dL
Protein, Ur: 32.3 mg/dL
Protein/Creat Ratio: 280 mg/g creat — ABNORMAL HIGH (ref 0–200)

## 2021-03-12 LAB — CREATININE, SERUM
Creatinine, Ser: 0.36 mg/dL — ABNORMAL LOW (ref 0.57–1.00)
eGFR: 132 mL/min/{1.73_m2} (ref >59)

## 2021-03-12 LAB — ALT: ALT: 7 IU/L (ref 0–32)

## 2021-03-12 LAB — LACTATE DEHYDROGENASE: LDH: 173 IU/L (ref 119–226)

## 2021-03-25 ENCOUNTER — Telehealth (INDEPENDENT_AMBULATORY_CARE_PROVIDER_SITE_OTHER): Payer: Medicaid Other | Admitting: Obstetrics and Gynecology

## 2021-03-25 ENCOUNTER — Encounter: Payer: Self-pay | Admitting: Obstetrics and Gynecology

## 2021-03-25 DIAGNOSIS — O09522 Supervision of elderly multigravida, second trimester: Secondary | ICD-10-CM

## 2021-03-25 DIAGNOSIS — O34219 Maternal care for unspecified type scar from previous cesarean delivery: Secondary | ICD-10-CM

## 2021-03-25 DIAGNOSIS — O09523 Supervision of elderly multigravida, third trimester: Secondary | ICD-10-CM

## 2021-03-25 DIAGNOSIS — Z98891 History of uterine scar from previous surgery: Secondary | ICD-10-CM

## 2021-03-25 DIAGNOSIS — Z3A31 31 weeks gestation of pregnancy: Secondary | ICD-10-CM

## 2021-03-25 DIAGNOSIS — O0990 Supervision of high risk pregnancy, unspecified, unspecified trimester: Secondary | ICD-10-CM

## 2021-03-25 NOTE — Progress Notes (Signed)
[redacted]w[redacted]d, no questions/concerns.

## 2021-03-25 NOTE — Progress Notes (Signed)
OBSTETRICS PRENATAL VIRTUAL VISIT ENCOUNTER NOTE  Provider location: [facility]   Patient location: Home  I connected with Christine Page on 03/25/21 at  2:30 PM EST by MyChart Video Encounter and verified that I am speaking with the correct person using two identifiers. I discussed the limitations, risks, security and privacy concerns of performing an evaluation and management service virtually and the availability of in person appointments. I also discussed with the patient that there may be a patient responsible charge related to this service. The patient expressed understanding and agreed to proceed. Subjective:  Christine Page is a 40 y.o. [phone] at [redacted]w[redacted]d being seen today for ongoing prenatal care.  She is currently monitored for the following issues for this high-risk pregnancy and has Supervision of high risk pregnancy, antepartum; Advanced maternal age in multigravida, second trimester; History of 2 cesarean sections; and Cervical polyp on their problem list.  Patient reports no complaints.  Contractions: Not present. Vag. Bleeding: None.  Movement: Present. Denies any leaking of fluid.   The following portions of the patient's history were reviewed and updated as appropriate: allergies, current medications, past family history, past medical history, past social history, past surgical history and problem list.   Objective:  There were no vitals filed for this visit.  Fetal Status:     Movement: Present     General:  Alert, oriented and cooperative. Patient is in no acute distress.  Respiratory: Normal respiratory effort, no problems with respiration noted  Mental Status: Normal mood and affect. Normal behavior. Normal judgment and thought content.  Rest of physical exam deferred due to type of encounter  Imaging: [place] MFM OB FOLLOW UP  Result Date: 03/05/2021 ----------------------------------------------------------------------   OBSTETRICS REPORT                       (Signed Final 03/05/2021 04:37 pm) ---------------------------------------------------------------------- Patient Info  ID #:       [id number]                          D.O.B.:  1980/10/08 (40 yrs)  Name:       Christine Page                      Visit Date: 03/05/2021 03:37 pm              [place] ---------------------------------------------------------------------- Performed By  Attending:        Sander Nephew      Secondary Phy.:   Carolann Littler                    MD                                                             BASS MD  Performed By:     Rodrigo Ran BS      Address:          [facility]                                                             [facility]  Referred By:      [place]             Location:         [facility]                                                             [place] [facility]                                                             [facility]  Ref. Address:     [place]                    [place]                    [place] ---------------------------------------------------------------------- Orders  #  Description                           Code        Ordered By  1  [place] MFM OB FOLLOW UP                   [id number]    Peterson Ao ----------------------------------------------------------------------  #  Order #                     Accession #                Episode #  1  [id number]                   [id number]                 [id number] ---------------------------------------------------------------------- Indications  [redacted] weeks gestation of pregnancy                Z3A.88  Advanced maternal age multigravida 76+,        O67.523  third trimester (40 y/o)  Hypothyroid (Synthroid)  O99.280  E03.9  Encounter for antenatal screening for          Z36.3  malformations  History of cesarean delivery, currently        O34.219  pregnant  LR NIPS/ Negative Horizon/ Negative AFP ---------------------------------------------------------------------- Fetal Evaluation  Num Of Fetuses:         1  Fetal Heart Rate(bpm):  152  Cardiac Activity:       Observed  Presentation:           Cephalic  Placenta:               Posterior  P. Cord Insertion:      Previously Visualized  Amniotic Fluid  AFI FV:      Within normal limits  AFI Sum(cm)     %Tile       Largest Pocket(cm)  17.5            66          5.1  RUQ(cm)       RLQ(cm)       LUQ(cm)        LLQ(cm)  4.4           3.1           5.1            4.9 ---------------------------------------------------------------------- Biometry  BPD:      71.3  mm     G. Age:  28w 4d         54  %    CI:        70.33   %    70 - 86                                                          FL/HC:      19.7   %    18.8 - 20.6  HC:      271.1  mm     G. Age:  29w 4d         64  %    HC/AC:      1.12        1.05 - 1.21  AC:      242.4  mm     G. Age:  28w 4d         54  %    FL/BPD:     74.8   %    71 - 87  FL:       53.3  mm     G. Age:  28w 2d         39  %    FL/AC:      22.0   %    20 - 24  HUM:      47.7  mm     G. Age:  28w 0d         43  %  LV:        3.2  mm  Est. FW:    1245  gm    2 lb 12 oz      53  % ---------------------------------------------------------------------- OB History  Blood Type:   O+  Gravidity:    2         Term:   1  Prem:   0        SAB:   0  TOP:          0       Ectopic:  0        Living: 0 ---------------------------------------------------------------------- Gestational Age  LMP:           28w 6d        Date:  08/15/20                 EDD:   05/22/21  U/S Today:     28w 5d                                        EDD:   05/23/21  Best:          28w 1d     Det. ByLoman Chroman         EDD:   05/27/21                                       (10/14/20) ---------------------------------------------------------------------- Anatomy  Cranium:               Appears normal         LVOT:                   Appears normal  Cavum:                 Previously seen        Aortic Arch:            Appears normal  Ventricles:            Appears normal         Ductal Arch:            Appears normal  Choroid Plexus:        Appears normal         Diaphragm:              Appears normal  Cerebellum:            Previously seen        Stomach:                Appears normal, left                                                                        sided  Posterior Fossa:       Previously seen        Abdomen:                Appears normal  Nuchal Fold:           Previously seen        Abdominal Wall:         Previously seen  Face:                  Appears normal         Cord Vessels:  Appears normal (3                         (orbits and profile)                           vessel cord)  Lips:                  Appears normal         Kidneys:                Appear normal  Palate:                Appears normal         Bladder:                Appears normal  Thoracic:              Appears normal         Spine:                  Appears normal  Heart:                 Appears normal         Upper Extremities:      Previously seen                         (4CH, axis, and                         situs)  RVOT:                  Appears normal         Lower Extremities:      Previously seen  Other:  Female gender previously seen. Nasal bone, Lenses, VC, 3VV,          3VTV, Heels/feet, open hands/5th digits previously visualized. ---------------------------------------------------------------------- Cervix Uterus Adnexa  Cervix  Not visualized (advanced GA >24wks)  Uterus  No abnormality visualized.  Right Ovary  Not visualized.  Left Ovary  Not visualized.  Cul De Sac  No free fluid seen.  Adnexa  No abnormality visualized.  ---------------------------------------------------------------------- Impression  Follow up growth due to [place] of 41 yo and hypothryodism on  112 mcg of synthroid.  Normal interval growth with measurements consistent with  dates  Good fetal movement and amniotic fluid volume  Ms. Pirozzi's TSH is elevated of 3.94 on 112 mcg of  synthroid. Ideally her TSH should around < 2.5. Therefore I  recommend increasing her synthroid and repeat labs in 4  weeks.  I messaged her practice Dr. Roselie Awkward, Dr. Dione Plover and Dr.  Elly Modena notifying them of the recommended increase in her  medical therapy via EPIC.  All questions answered  I spent 20 minutes with > 50% in face to face consultation. ---------------------------------------------------------------------- Recommendations  Follow up growth in 4 weeks  Initiate weekly testing at 34-36 weeks ----------------------------------------------------------------------               Sander Nephew, MD Electronically Signed Final Report   03/05/2021 04:37 pm ----------------------------------------------------------------------   Assessment and Plan:  Pregnancy: Christine [id number] at [redacted]w[redacted]d 1. Supervision of high risk pregnancy, antepartum Patient is doing well without complaints Follow up Thyroid studies next visit  2. Advanced maternal age in multigravida, second trimester Continue ASA Follow up growth ultrasound  04/02/21  3. History of 2 cesarean sections Will be scheduled for repeat Patient undecided on contraception  Preterm labor symptoms and general obstetric precautions including but not limited to vaginal bleeding, contractions, leaking of fluid and fetal movement were reviewed in detail with the patient. I discussed the assessment and treatment plan with the patient. The patient was provided an opportunity to ask questions and all were answered. The patient agreed with the plan and demonstrated an understanding of the instructions. The patient was advised to call back  or seek an in-person office evaluation/go to [facility] at [facility] for any urgent or concerning symptoms. Please refer to After Visit Summary for other counseling recommendations.   I provided 15 minutes of face-to-face time during this encounter.  Return in about 2 weeks (around 04/08/2021) for in person, ROB, High risk.  Future Appointments  Date Time Provider [place]  03/25/2021  2:30 PM Lashaya Kienitz, Vickii Chafe, MD [place] None  04/02/2021  3:30 PM [facility] NURSE [facility]  04/02/2021  3:45 PM [facility] US5 [facility] [place], MD [facility] [employer], [place]

## 2021-03-30 ENCOUNTER — Other Ambulatory Visit: Payer: Self-pay | Admitting: Obstetrics and Gynecology

## 2021-03-30 DIAGNOSIS — Z98891 History of uterine scar from previous surgery: Secondary | ICD-10-CM

## 2021-04-02 ENCOUNTER — Other Ambulatory Visit: Payer: Self-pay

## 2021-04-02 ENCOUNTER — Other Ambulatory Visit: Payer: Self-pay | Admitting: Maternal & Fetal Medicine

## 2021-04-02 ENCOUNTER — Ambulatory Visit: Payer: Medicaid Other | Admitting: *Deleted

## 2021-04-02 ENCOUNTER — Encounter: Payer: Self-pay | Admitting: *Deleted

## 2021-04-02 ENCOUNTER — Ambulatory Visit: Payer: Medicaid Other | Attending: Maternal & Fetal Medicine

## 2021-04-02 VITALS — BP 107/59 | HR 83

## 2021-04-02 DIAGNOSIS — O09523 Supervision of elderly multigravida, third trimester: Secondary | ICD-10-CM

## 2021-04-02 DIAGNOSIS — O34219 Maternal care for unspecified type scar from previous cesarean delivery: Secondary | ICD-10-CM | POA: Diagnosis not present

## 2021-04-02 DIAGNOSIS — Z3A32 32 weeks gestation of pregnancy: Secondary | ICD-10-CM | POA: Diagnosis not present

## 2021-04-02 DIAGNOSIS — O0990 Supervision of high risk pregnancy, unspecified, unspecified trimester: Secondary | ICD-10-CM | POA: Diagnosis present

## 2021-04-03 ENCOUNTER — Other Ambulatory Visit: Payer: Self-pay | Admitting: *Deleted

## 2021-04-03 DIAGNOSIS — O09523 Supervision of elderly multigravida, third trimester: Secondary | ICD-10-CM

## 2021-04-03 DIAGNOSIS — O403XX0 Polyhydramnios, third trimester, not applicable or unspecified: Secondary | ICD-10-CM

## 2021-04-03 DIAGNOSIS — O99283 Endocrine, nutritional and metabolic diseases complicating pregnancy, third trimester: Secondary | ICD-10-CM

## 2021-04-03 DIAGNOSIS — E039 Hypothyroidism, unspecified: Secondary | ICD-10-CM

## 2021-04-08 ENCOUNTER — Ambulatory Visit (INDEPENDENT_AMBULATORY_CARE_PROVIDER_SITE_OTHER): Payer: Medicaid Other | Admitting: Obstetrics & Gynecology

## 2021-04-08 ENCOUNTER — Other Ambulatory Visit: Payer: Self-pay

## 2021-04-08 ENCOUNTER — Encounter: Payer: Medicaid Other | Admitting: Obstetrics & Gynecology

## 2021-04-08 VITALS — BP 99/66 | HR 101 | Wt 157.0 lb

## 2021-04-08 DIAGNOSIS — O0990 Supervision of high risk pregnancy, unspecified, unspecified trimester: Secondary | ICD-10-CM

## 2021-04-08 DIAGNOSIS — Z98891 History of uterine scar from previous surgery: Secondary | ICD-10-CM

## 2021-04-08 DIAGNOSIS — O09522 Supervision of elderly multigravida, second trimester: Secondary | ICD-10-CM

## 2021-04-08 NOTE — Progress Notes (Signed)
   PRENATAL VISIT NOTE  Subjective:  Christine Page is a 40 y.o. [phone] at [redacted]w[redacted]d being seen today for ongoing prenatal care.  She is currently monitored for the following issues for this high-risk pregnancy and has Supervision of high risk pregnancy, antepartum; Advanced maternal age in multigravida, second trimester; History of 2 cesarean sections; and Cervical polyp on their problem list.  Patient reports no complaints.  Contractions: Not present. Vag. Bleeding: None.  Movement: Present. Denies leaking of fluid.   The following portions of the patient's history were reviewed and updated as appropriate: allergies, current medications, past family history, past medical history, past social history, past surgical history and problem list.   Objective:   Vitals:   04/08/21 1427  BP: 99/66  Pulse: (!) 101  Weight: 157 lb (71.2 kg)    Fetal Status: Fetal Heart Rate (bpm): 148   Movement: Present     General:  Alert, oriented and cooperative. Patient is in no acute distress.  Skin: Skin is warm and dry. No rash noted.   Cardiovascular: Normal heart rate noted  Respiratory: Normal respiratory effort, no problems with respiration noted  Abdomen: Soft, gravid, appropriate for gestational age.  Pain/Pressure: Present     Pelvic: Cervical exam deferred        Extremities: Normal range of motion.  Edema: None  Mental Status: Normal mood and affect. Normal behavior. Normal judgment and thought content.   Assessment and Plan:  Pregnancy: [id number] at [redacted]w[redacted]d 1. Supervision of high risk pregnancy, antepartum hypothyroid - TSH  2. Advanced maternal age in multigravida, second trimester Normal growth  3. History of 2 cesarean sections Considering BTL, will sign medicaid paper  Preterm labor symptoms and general obstetric precautions including but not limited to vaginal bleeding, contractions, leaking of fluid and fetal movement were reviewed in detail with the patient. Please refer  to After Visit Summary for other counseling recommendations.   Return in about 2 weeks (around 04/22/2021).  Future Appointments  Date Time Provider Department Center  04/30/2021  3:30 PM [facility] NURSE [facility]  04/30/2021  3:45 PM [facility] US5 [facility]    Scheryl Darter, MD

## 2021-04-09 LAB — TSH: TSH: 0.896 u[IU]/mL (ref 0.450–4.500)

## 2021-04-22 ENCOUNTER — Ambulatory Visit (INDEPENDENT_AMBULATORY_CARE_PROVIDER_SITE_OTHER): Payer: Medicaid Other | Admitting: Obstetrics and Gynecology

## 2021-04-22 ENCOUNTER — Encounter: Payer: Self-pay | Admitting: Obstetrics and Gynecology

## 2021-04-22 ENCOUNTER — Other Ambulatory Visit: Payer: Self-pay

## 2021-04-22 VITALS — BP 99/66 | HR 77 | Wt 161.2 lb

## 2021-04-22 DIAGNOSIS — O09522 Supervision of elderly multigravida, second trimester: Secondary | ICD-10-CM

## 2021-04-22 DIAGNOSIS — O99283 Endocrine, nutritional and metabolic diseases complicating pregnancy, third trimester: Secondary | ICD-10-CM

## 2021-04-22 DIAGNOSIS — O0990 Supervision of high risk pregnancy, unspecified, unspecified trimester: Secondary | ICD-10-CM

## 2021-04-22 DIAGNOSIS — Z3009 Encounter for other general counseling and advice on contraception: Secondary | ICD-10-CM

## 2021-04-22 DIAGNOSIS — Z98891 History of uterine scar from previous surgery: Secondary | ICD-10-CM

## 2021-04-22 DIAGNOSIS — E039 Hypothyroidism, unspecified: Secondary | ICD-10-CM | POA: Insufficient documentation

## 2021-04-22 NOTE — Progress Notes (Signed)
Subjective:  Christine Page is a 40 y.o. [phone] at [redacted]w[redacted]d being seen today for ongoing prenatal care.  She is currently monitored for the following issues for this high-risk pregnancy and has Supervision of high risk pregnancy, antepartum; Advanced maternal age in multigravida, second trimester; History of 2 cesarean sections; Cervical polyp; Hypothyroidism affecting pregnancy; and Unwanted fertility on their problem list.  Patient reports general discomforts of pregnancy.  Contractions: Not present. Vag. Bleeding: None.  Movement: Present. Denies leaking of fluid.   The following portions of the patient's history were reviewed and updated as appropriate: allergies, current medications, past family history, past medical history, past social history, past surgical history and problem list. Problem list updated.  Objective:   Vitals:   04/22/21 1423  BP: 99/66  Pulse: 77  Weight: 161 lb 3.2 oz (73.1 kg)    Fetal Status: Fetal Heart Rate (bpm): 155   Movement: Present     General:  Alert, oriented and cooperative. Patient is in no acute distress.  Skin: Skin is warm and dry. No rash noted.   Cardiovascular: Normal heart rate noted  Respiratory: Normal respiratory effort, no problems with respiration noted  Abdomen: Soft, gravid, appropriate for gestational age. Pain/Pressure: Absent     Pelvic:  Cervical exam deferred        Extremities: Normal range of motion.  Edema: Trace  Mental Status: Normal mood and affect. Normal behavior. Normal judgment and thought content.   Urinalysis:      Assessment and Plan:  Pregnancy: [id number] at [redacted]w[redacted]d  1. Supervision of high risk pregnancy, antepartum Stable GBS and vaginal cultures next visit  2. History of 2 cesarean sections Repeat at 39 weeks, 05/20/21  3. Advanced maternal age in multigravida, second trimester Stable Serial growth scans and antenatal testing starting as 36 weeks  4. Hypothyroidism affecting pregnancy in third  trimester Stable  5. Unwanted fertility BTL papers signed  Preterm labor symptoms and general obstetric precautions including but not limited to vaginal bleeding, contractions, leaking of fluid and fetal movement were reviewed in detail with the patient. Please refer to After Visit Summary for other counseling recommendations.  Return in about 1 week (around 04/29/2021) for OB visit, face to face, any provider.   Hermina Staggers, MD

## 2021-04-22 NOTE — Progress Notes (Signed)
Patient presents for ROB. Patient has no concerns today. 

## 2021-04-22 NOTE — Patient Instructions (Signed)

## 2021-04-30 ENCOUNTER — Ambulatory Visit: Payer: Medicaid Other | Attending: Maternal & Fetal Medicine

## 2021-04-30 ENCOUNTER — Ambulatory Visit: Payer: Medicaid Other | Admitting: *Deleted

## 2021-04-30 ENCOUNTER — Other Ambulatory Visit: Payer: Self-pay

## 2021-04-30 VITALS — BP 96/61 | HR 82

## 2021-04-30 DIAGNOSIS — O403XX0 Polyhydramnios, third trimester, not applicable or unspecified: Secondary | ICD-10-CM | POA: Diagnosis present

## 2021-04-30 DIAGNOSIS — O09523 Supervision of elderly multigravida, third trimester: Secondary | ICD-10-CM | POA: Insufficient documentation

## 2021-04-30 DIAGNOSIS — E039 Hypothyroidism, unspecified: Secondary | ICD-10-CM | POA: Diagnosis present

## 2021-04-30 DIAGNOSIS — O0990 Supervision of high risk pregnancy, unspecified, unspecified trimester: Secondary | ICD-10-CM | POA: Diagnosis present

## 2021-04-30 DIAGNOSIS — O99283 Endocrine, nutritional and metabolic diseases complicating pregnancy, third trimester: Secondary | ICD-10-CM | POA: Diagnosis present

## 2021-04-30 DIAGNOSIS — Z3A36 36 weeks gestation of pregnancy: Secondary | ICD-10-CM

## 2021-05-01 ENCOUNTER — Other Ambulatory Visit: Payer: Self-pay | Admitting: *Deleted

## 2021-05-01 DIAGNOSIS — O34219 Maternal care for unspecified type scar from previous cesarean delivery: Secondary | ICD-10-CM

## 2021-05-01 DIAGNOSIS — E039 Hypothyroidism, unspecified: Secondary | ICD-10-CM

## 2021-05-01 DIAGNOSIS — O09523 Supervision of elderly multigravida, third trimester: Secondary | ICD-10-CM

## 2021-05-07 ENCOUNTER — Other Ambulatory Visit: Payer: Self-pay

## 2021-05-07 ENCOUNTER — Ambulatory Visit: Payer: Medicaid Other | Attending: Obstetrics | Admitting: *Deleted

## 2021-05-07 ENCOUNTER — Ambulatory Visit: Payer: Medicaid Other | Admitting: *Deleted

## 2021-05-07 VITALS — BP 105/64 | HR 80

## 2021-05-07 DIAGNOSIS — Z3A37 37 weeks gestation of pregnancy: Secondary | ICD-10-CM | POA: Diagnosis not present

## 2021-05-07 DIAGNOSIS — O409XX0 Polyhydramnios, unspecified trimester, not applicable or unspecified: Secondary | ICD-10-CM | POA: Insufficient documentation

## 2021-05-07 DIAGNOSIS — O09523 Supervision of elderly multigravida, third trimester: Secondary | ICD-10-CM | POA: Diagnosis not present

## 2021-05-07 DIAGNOSIS — O0990 Supervision of high risk pregnancy, unspecified, unspecified trimester: Secondary | ICD-10-CM

## 2021-05-07 NOTE — Procedures (Signed)
Christine Page December 26, 1980 [redacted]w[redacted]d  Fetus A Non-Stress Test Interpretation for 05/07/21  Indication: Advanced Maternal Age >40 years, Polyhydramnios  Fetal Heart Rate A Mode: External Baseline Rate (A): 140 bpm Variability: Moderate Accelerations: 15 x 15 Decelerations: None Multiple birth?: No  Uterine Activity Mode: Palpation, Toco Contraction Frequency (min): occ with ui Contraction Duration (sec): 50-70 Contraction Quality: Mild Resting Tone Palpated: Relaxed Resting Time: Adequate  Interpretation (Fetal Testing) Nonstress Test Interpretation: Reactive Overall Impression: Reassuring for gestational age Comments: Dr. Judeth Cornfield reviewed tracing

## 2021-05-10 ENCOUNTER — Other Ambulatory Visit: Payer: Self-pay | Admitting: Obstetrics and Gynecology

## 2021-05-10 DIAGNOSIS — E039 Hypothyroidism, unspecified: Secondary | ICD-10-CM

## 2021-05-11 ENCOUNTER — Other Ambulatory Visit: Payer: Self-pay

## 2021-05-11 ENCOUNTER — Ambulatory Visit (INDEPENDENT_AMBULATORY_CARE_PROVIDER_SITE_OTHER): Payer: Medicaid Other | Admitting: Advanced Practice Midwife

## 2021-05-11 ENCOUNTER — Encounter (HOSPITAL_COMMUNITY): Payer: Self-pay

## 2021-05-11 VITALS — BP 103/66 | HR 79 | Wt 163.0 lb

## 2021-05-11 DIAGNOSIS — O0990 Supervision of high risk pregnancy, unspecified, unspecified trimester: Secondary | ICD-10-CM

## 2021-05-11 DIAGNOSIS — Z3A37 37 weeks gestation of pregnancy: Secondary | ICD-10-CM

## 2021-05-11 DIAGNOSIS — O09522 Supervision of elderly multigravida, second trimester: Secondary | ICD-10-CM

## 2021-05-11 DIAGNOSIS — O99353 Diseases of the nervous system complicating pregnancy, third trimester: Secondary | ICD-10-CM

## 2021-05-11 DIAGNOSIS — E039 Hypothyroidism, unspecified: Secondary | ICD-10-CM

## 2021-05-11 DIAGNOSIS — Z3009 Encounter for other general counseling and advice on contraception: Secondary | ICD-10-CM

## 2021-05-11 DIAGNOSIS — G5600 Carpal tunnel syndrome, unspecified upper limb: Secondary | ICD-10-CM

## 2021-05-11 DIAGNOSIS — O99283 Endocrine, nutritional and metabolic diseases complicating pregnancy, third trimester: Secondary | ICD-10-CM

## 2021-05-11 DIAGNOSIS — Z98891 History of uterine scar from previous surgery: Secondary | ICD-10-CM

## 2021-05-11 MED ORDER — WRIST BRACE/RIGHT SMALL MISC: 1.0000 | Freq: Every day | 0 refills | Status: DC

## 2021-05-11 MED ORDER — WRIST BRACE/LEFT SMALL MISC: 1.0000 | Freq: Every day | 0 refills | Status: DC

## 2021-05-11 NOTE — Progress Notes (Signed)
° °  PRENATAL VISIT NOTE  Subjective:  Christine Page is a 41 y.o. [phone] at [redacted]w[redacted]d being seen today for ongoing prenatal care.  She is currently monitored for the following issues for this high-risk pregnancy and has Supervision of high risk pregnancy, antepartum; Advanced maternal age in multigravida, second trimester; History of 2 cesarean sections; Cervical polyp; Hypothyroidism affecting pregnancy; and Unwanted fertility on their problem list.  Patient reports no complaints.  Contractions: Not present. Vag. Bleeding: None.  Movement: Present. Denies leaking of fluid.   The following portions of the patient's history were reviewed and updated as appropriate: allergies, current medications, past family history, past medical history, past social history, past surgical history and problem list.   Objective:   Vitals:   05/11/21 1617  BP: 103/66  Pulse: 79  Weight: 163 lb (73.9 kg)    Fetal Status: Fetal Heart Rate (bpm): 160   Movement: Present     General:  Alert, oriented and cooperative. Patient is in no acute distress.  Skin: Skin is warm and dry. No rash noted.   Cardiovascular: Normal heart rate noted  Respiratory: Normal respiratory effort, no problems with respiration noted  Abdomen: Soft, gravid, appropriate for gestational age.  Pain/Pressure: Absent     Pelvic: Cervical exam deferred        Extremities: Normal range of motion.     Mental Status: Normal mood and affect. Normal behavior. Normal judgment and thought content.   Assessment and Plan:  Pregnancy: [id number] at [redacted]w[redacted]d 1. Supervision of high risk pregnancy, antepartum --Anticipatory guidance about next visits/weeks of pregnancy given. --Pt declines GBS and GCC.  Discussed reasons for these tests, risks to baby if pt goes into labor with GBS.  Pediatrician may want baby to stay for 48 hours if GBS unknown, but pt will likely stay 48 hours + due to cesarean. Pt prefers not to collect these swabs.  --next appt  in 1 week --Repeat C/S scheduled 05/20/21  2. History of 2 cesarean sections --Plans repeat  3. Advanced maternal age in multigravida, second trimester   4. Hypothyroidism affecting pregnancy in third trimester   5. Unwanted fertility --BTL papers signed 04/08/21  6. [redacted] weeks gestation of pregnancy   Term labor symptoms and general obstetric precautions including but not limited to vaginal bleeding, contractions, leaking of fluid and fetal movement were reviewed in detail with the patient. Please refer to After Visit Summary for other counseling recommendations.   No follow-ups on file.  Future Appointments  Date Time Provider [place]  05/14/2021  2:00 PM [facility] NURSE [facility]  05/14/2021  2:15 PM [facility] NST [facility]  05/18/2021 10:00 AM [facility] PAT 1 [facility] None  05/18/2021  2:50 PM [place], Kathie Dike, CNM [facility] None  05/19/2021 11:00 AM [facility] NURSE [facility]  05/19/2021 11:15 AM [facility] US2 [facility]    Fatima Blank, CNM

## 2021-05-11 NOTE — Patient Instructions (Signed)
Lakindra Alilah Mcmeans  05/11/2021   Your procedure is scheduled on:  05/20/2021  Arrive at 0730 at [employer] [facility] on [address] at [facility]  and [employer]. You are invited to use the FREE valet parking or use the Visitor's parking deck.  Pick up the phone at the desk and dial [phone].  Call this number if you have problems the morning of surgery: [phone]  Remember:   Do not eat food:(After Midnight) Desps de medianoche.  Do not drink clear liquids: (After Midnight) Desps de medianoche.  Take these medicines the morning of surgery with A SIP OF WATER:  Levothyroxine as prescribed   Do not wear jewelry, make-up or nail polish.  Do not wear lotions, powders, or perfumes. Do not wear deodorant.  Do not shave 48 hours prior to surgery.  Do not bring valuables to the hospital.  [facility] is not   responsible for any belongings or valuables brought to the hospital.  Contacts, dentures or bridgework may not be worn into surgery.  Leave suitcase in the car. After surgery it may be brought to your room.  For patients admitted to the hospital, checkout time is 11:00 AM the day of              discharge.      Please read over the following fact sheets that you were given:     Preparing for Surgery

## 2021-05-14 ENCOUNTER — Other Ambulatory Visit: Payer: Self-pay

## 2021-05-14 ENCOUNTER — Ambulatory Visit: Payer: Medicaid Other | Admitting: *Deleted

## 2021-05-14 ENCOUNTER — Ambulatory Visit: Payer: Medicaid Other | Attending: Obstetrics | Admitting: *Deleted

## 2021-05-14 VITALS — BP 109/68 | HR 83

## 2021-05-14 DIAGNOSIS — Z3A38 38 weeks gestation of pregnancy: Secondary | ICD-10-CM | POA: Diagnosis not present

## 2021-05-14 DIAGNOSIS — E039 Hypothyroidism, unspecified: Secondary | ICD-10-CM

## 2021-05-14 DIAGNOSIS — O09523 Supervision of elderly multigravida, third trimester: Secondary | ICD-10-CM | POA: Diagnosis present

## 2021-05-14 DIAGNOSIS — O403XX0 Polyhydramnios, third trimester, not applicable or unspecified: Secondary | ICD-10-CM | POA: Diagnosis not present

## 2021-05-14 DIAGNOSIS — O0990 Supervision of high risk pregnancy, unspecified, unspecified trimester: Secondary | ICD-10-CM

## 2021-05-14 DIAGNOSIS — Z98891 History of uterine scar from previous surgery: Secondary | ICD-10-CM

## 2021-05-14 DIAGNOSIS — O409XX0 Polyhydramnios, unspecified trimester, not applicable or unspecified: Secondary | ICD-10-CM

## 2021-05-14 DIAGNOSIS — O99283 Endocrine, nutritional and metabolic diseases complicating pregnancy, third trimester: Secondary | ICD-10-CM

## 2021-05-14 NOTE — Procedures (Signed)
Christine Page May 18, 1980 [redacted]w[redacted]d  Fetus A Non-Stress Test Interpretation for 05/14/21  Indication: Advanced Maternal Age >40 years, polyhydramnios  Fetal Heart Rate A Mode: External Baseline Rate (A): 140 bpm Variability: Moderate Accelerations: 15 x 15 Decelerations: None Multiple birth?: No  Uterine Activity Mode: Palpation, Toco Contraction Frequency (min): ui Resting Tone Palpated: Relaxed  Interpretation (Fetal Testing) Nonstress Test Interpretation: Reactive Overall Impression: Reassuring for gestational age Comments: Dr. Annamaria Boots reviewed tracing

## 2021-05-18 ENCOUNTER — Other Ambulatory Visit: Payer: Self-pay | Admitting: Obstetrics and Gynecology

## 2021-05-18 ENCOUNTER — Other Ambulatory Visit (HOSPITAL_COMMUNITY)
Admission: RE | Admit: 2021-05-18 | Discharge: 2021-05-18 | Disposition: A | Discharge status: Home / Self Care | Payer: Medicaid Other | Source: Ambulatory Visit | Attending: Obstetrics and Gynecology | Admitting: Obstetrics and Gynecology

## 2021-05-18 ENCOUNTER — Other Ambulatory Visit: Payer: Self-pay

## 2021-05-18 ENCOUNTER — Encounter: Payer: Self-pay | Admitting: Advanced Practice Midwife

## 2021-05-18 ENCOUNTER — Ambulatory Visit (INDEPENDENT_AMBULATORY_CARE_PROVIDER_SITE_OTHER): Payer: Medicaid Other | Admitting: Obstetrics and Gynecology

## 2021-05-18 VITALS — BP 103/61 | HR 74 | Wt 162.4 lb

## 2021-05-18 DIAGNOSIS — Z3009 Encounter for other general counseling and advice on contraception: Secondary | ICD-10-CM

## 2021-05-18 DIAGNOSIS — Z98891 History of uterine scar from previous surgery: Secondary | ICD-10-CM | POA: Diagnosis not present

## 2021-05-18 DIAGNOSIS — O0990 Supervision of high risk pregnancy, unspecified, unspecified trimester: Secondary | ICD-10-CM

## 2021-05-18 DIAGNOSIS — O99283 Endocrine, nutritional and metabolic diseases complicating pregnancy, third trimester: Secondary | ICD-10-CM

## 2021-05-18 DIAGNOSIS — Z01812 Encounter for preprocedural laboratory examination: Secondary | ICD-10-CM | POA: Insufficient documentation

## 2021-05-18 DIAGNOSIS — Z3A38 38 weeks gestation of pregnancy: Secondary | ICD-10-CM

## 2021-05-18 DIAGNOSIS — E039 Hypothyroidism, unspecified: Secondary | ICD-10-CM

## 2021-05-18 DIAGNOSIS — O09522 Supervision of elderly multigravida, second trimester: Secondary | ICD-10-CM

## 2021-05-18 LAB — CBC
HCT: 36.3 % (ref 36.0–46.0)
Hemoglobin: 12.2 g/dL (ref 12.0–15.0)
MCH: 30.7 pg (ref 26.0–34.0)
MCHC: 33.6 g/dL (ref 30.0–36.0)
MCV: 91.4 fL (ref 80.0–100.0)
Platelets: 194 10*3/uL (ref 150–400)
RBC: 3.97 MIL/uL (ref 3.87–5.11)
RDW: 13.2 % (ref 11.5–15.5)
WBC: 3.8 10*3/uL — ABNORMAL LOW (ref 4.0–10.5)
nRBC: 0 % (ref 0.0–0.2)

## 2021-05-18 LAB — TYPE AND SCREEN
ABO/RH(D): AB POS
Antibody Screen: NEGATIVE

## 2021-05-18 LAB — SARS CORONAVIRUS 2 (TAT 6-24 HRS): SARS Coronavirus 2: NEGATIVE

## 2021-05-18 NOTE — Progress Notes (Signed)
Subjective:  Christine Page is a 41 y.o. [phone] at [redacted]w[redacted]d being seen today for ongoing prenatal care.  She is currently monitored for the following issues for this high-risk pregnancy and has Supervision of high risk pregnancy, antepartum; Advanced maternal age in multigravida, second trimester; History of 2 cesarean sections; Cervical polyp; Hypothyroidism affecting pregnancy; and Unwanted fertility on their problem list.  Patient reports general discomforts of pregnancy.  Contractions: Not present. Vag. Bleeding: None.  Movement: Present. Denies leaking of fluid.   The following portions of the patient's history were reviewed and updated as appropriate: allergies, current medications, past family history, past medical history, past social history, past surgical history and problem list. Problem list updated.  Objective:   Vitals:   05/18/21 1507  BP: 103/61  Pulse: 74  Weight: 162 lb 6.4 oz (73.7 kg)    Fetal Status: Fetal Heart Rate (bpm): 150   Movement: Present     General:  Alert, oriented and cooperative. Patient is in no acute distress.  Skin: Skin is warm and dry. No rash noted.   Cardiovascular: Normal heart rate noted  Respiratory: Normal respiratory effort, no problems with respiration noted  Abdomen: Soft, gravid, appropriate for gestational age. Pain/Pressure: Absent     Pelvic:  Cervical exam deferred        Extremities: Normal range of motion.  Edema: Mild pitting, slight indentation  Mental Status: Normal mood and affect. Normal behavior. Normal judgment and thought content.   Urinalysis:      Assessment and Plan:  Pregnancy: [id number] at [redacted]w[redacted]d  1. Supervision of high risk pregnancy, antepartum Stable Labor precautions  2. Advanced maternal age in multigravida, second trimester Stable  3. History of 2 cesarean sections For repeat on Wednesday  4. Hypothyroidism affecting pregnancy in third trimester Last TSH normal     6. Unwanted  fertility BTL papers signed  Term labor symptoms and general obstetric precautions including but not limited to vaginal bleeding, contractions, leaking of fluid and fetal movement were reviewed in detail with the patient. Please refer to After Visit Summary for other counseling recommendations.  Return for 1 week incision check from 05/20/21, 4 week PP visit form that date  as well.   Hermina Staggers, MD

## 2021-05-18 NOTE — Progress Notes (Signed)
Pt presents for ROB reports no complaints. C/S scheduled 05/20/21

## 2021-05-18 NOTE — Patient Instructions (Signed)
Cesarean Delivery, Care After °The following information offers guidance on how to care for yourself after your procedure. Your health care provider may also give you more specific instructions. If you have problems or questions, contact your health care provider. °What can I expect after the procedure? °After the procedure, it is common to have: °A small amount of blood or clear fluid coming from the incision. °Some redness, swelling, and pain in your incision area. °Some abdominal pain and soreness. °Vaginal bleeding (lochia). Even though you did not have a vaginal delivery, you will still have vaginal bleeding and discharge. °Pelvic cramps. °Fatigue. °You may have pain, swelling, and discomfort in the tissue between your vagina and your anus (perineum) if: °Your C-section was unplanned, and you were allowed to labor and push. °An incision was made in the area (episiotomy) or the tissue tore during attempted vaginal delivery. °Follow these instructions at home: °Medicines °Take over-the-counter and prescription medicines only as told by your health care provider. °If you were prescribed an antibiotic medicine, take it as told by your health care provider. Do not stop taking the antibiotic even if you start to feel better. °Ask your health care provider if the medicine prescribed to you requires you to avoid driving or using machinery. °Incision care ° °Follow instructions from your health care provider about how to take care of your incision. Make sure you: °Wash your hands with soap and water for an least 20 seconds before and after you change your bandage (dressing). If soap and water are not available, use hand sanitizer. °If you have a dressing, change it or remove it as told by your health care provider. °Leave stitches (sutures), skin staples, skin glue, or adhesive strips in place. These skin closures may need to stay in place for 2 weeks or longer. If adhesive strip edges start to loosen and curl up, you  may trim the loose edges. Do not remove adhesive strips completely unless your health care provider tells you to do that. °Check your incision area every day for signs of infection. Check for: °More redness, swelling, or pain. °More fluid or blood. °Warmth. °Pus or a bad smell. °Do not take baths, swim, or use a hot tub until your health care provider approves. Ask your health care provider if you may take showers. °When you cough or sneeze, hug a pillow. This helps with pain and decreases the chance of your incision opening up (dehiscing). Do this until your incision heals. °Managing constipation °Your procedure may cause constipation. To prevent or treat constipation, you may need to: °Drink enough fluid to keep your urine pale yellow. °Take over-the-counter or prescription medicines. °Eat foods that are high in fiber, such as beans, whole grains, and fresh fruits and vegetables. °Limit foods that are high in fat and processed sugars, such as fried or sweet foods. °Activity ° °If possible, have someone help you care for your baby and help with household activities for at least a few days after you leave the hospital. °Rest as much as possible. Try to rest or take a nap while your baby is sleeping. °You may have to avoid lifting. Ask your health care provider how much you can safely lift. °Return to your normal activities as told by your health care provider. Ask your health care provider what activities are safe for you. °Talk with your health care provider about when you can engage in sexual activity. This may depend on your: °Risk of infection. °How fast you heal. °Comfort   and desire to engage in sexual activity. °Lifestyle °Do not drink alcohol. This is especially important if you are breastfeeding or taking pain medicine. °Do not use any products that contain nicotine or tobacco. These products include cigarettes, chewing tobacco, and vaping devices, such as e-cigarettes. If you need help quitting, ask your  health care provider. °General instructions °Do not use tampons or douches until your health care provider approves. °Wear loose, comfortable clothing and a supportive and well-fitting bra. °If you pass a blood clot, save it and call your health care provider to discuss. Do not flush blood clots down the toilet before you get instructions from your health care provider. °Keep all follow-up visits for you and your baby. This is important. °Contact a health care provider if: °You have: °A fever. °Dizziness or light-headedness. °Bad-smelling vaginal discharge. °A blood clot pass from your vagina. °Pus, blood, or a bad smell coming from your incision. °An incision that feels warm to the touch. °More redness, swelling, or pain around your incision. °Difficulty or pain when urinating. °Nausea or vomiting. °Little or no interest in activities you used to enjoy. °Your breasts turn red or become painful or hard. °You feel unusually sad or worried. °You have questions about caring for yourself or your baby. °You have redness, swelling, and pain in an arm or leg. °Get help right away if: °You have: °Pain that does not go away or get better with medicine. °Chest pain. °Trouble breathing. °Blurred vision, spots, or flashing lights in your vision. °Thoughts about hurting yourself or your baby. °New pain in your abdomen or in one of your legs. °A severe headache that does not get better with pain medicine. °You faint. °You bleed from your vagina so much that you fill more than one sanitary pad in one hour. Bleeding should not be heavier than your heaviest period. °These symptoms may be an emergency. Get help right away. Call 911. °Do not wait to see if the symptoms will go away. °Do not drive yourself to the hospital. °Get help right away if you feel like you may hurt yourself or others, or have thoughts about taking your own life. Go to your nearest emergency room or: °Call 911. °Call the National Suicide Prevention Lifeline at  1-800-273-8255 or 988. This is open 24 hours a day. °Text the Crisis Text Line at 741741. °Summary °After the procedure, it is common to have pain at your incision site, abdominal cramping, and slight bleeding from your vagina. °Check your incision area every day for signs of infection. °Tell your health care provider about any unusual symptoms. °Keep all follow-up visits for you and your baby. This is important. °This information is not intended to replace advice given to you by your health care provider. Make sure you discuss any questions you have with your health care provider. °Document Revised: 11/19/2020 Document Reviewed: 11/19/2020 °Elsevier Patient Education © 2022 [employer] ° °

## 2021-05-19 ENCOUNTER — Ambulatory Visit: Payer: Medicaid Other

## 2021-05-19 ENCOUNTER — Encounter (HOSPITAL_COMMUNITY): Payer: Self-pay | Admitting: Obstetrics and Gynecology

## 2021-05-19 LAB — RPR: RPR Ser Ql: NONREACTIVE

## 2021-05-19 NOTE — H&P (Signed)
OBSTETRIC ADMISSION HISTORY AND PHYSICAL  Anneliese Parthena Fergeson is a 41 y.o. female [phone] with IUP at [redacted]w[redacted]d by early [place] presenting for scheduled repeat cesarean section. She reports +FMs, No LOF, no VB, no blurry vision, headaches or peripheral edema, and RUQ pain.  She plans on breast and bottle feeding. She is unsure what she would like for birth control. Previously signed BTL papers but now does not want. She received her prenatal care at  [facility]    Dating: By early [place] --->  Estimated Date of Delivery: 05/27/21  Sono:   @[redacted]w[redacted]d , CWD, normal anatomy, cephalic presentation, posterior placental lie, 3426g, 95% EFW. Polyhydramnios (AFI 36.37)   Prenatal History/Complications:  Mild polyhydramnios Hypothyroidism LGA (95%ile) Advanced maternal age  Past Medical History: Past Medical History:  Diagnosis Date   Thyroid disease     Past Surgical History: Past Surgical History:  Procedure Laterality Date   CESAREAN SECTION      Obstetrical History: OB History     Gravida  4   Para  2   Term  2   Preterm      AB  1   Living  2      SAB  1   IAB      Ectopic      Multiple      Live Births  2           Social History Social History   Socioeconomic History   Marital status: Single    Spouse name: Not on file   Number of children: Not on file   Years of education: Not on file   Highest education level: Not on file  Occupational History   Not on file  Tobacco Use   Smoking status: Never   Smokeless tobacco: Never  Vaping Use   Vaping Use: Never used  Substance and Sexual Activity   Alcohol use: Never   Drug use: Never   Sexual activity: Yes  Other Topics Concern   Not on file  Social History Narrative   Not on file   Social Determinants of Health   Financial Resource Strain: Not on file  Food Insecurity: Not on file  Transportation Needs: Not on file  Physical Activity: Not on file  Stress: Not on file  Social Connections: Not on  file    Family History: History reviewed. No pertinent family history.  Allergies: No Known Allergies  No medications prior to admission.     Review of Systems   All systems reviewed and negative except as stated in HPI  Last menstrual period 08/15/2020. General appearance: alert Lungs: clear to auscultation bilaterally Heart: regular rate and rhythm Abdomen: soft, non-tender; bowel sounds normal Extremities: Homans sign is negative, no sign of DVT      Prenatal labs: ABO, Rh: --/--/AB POS (01/16 1053) Antibody: NEG (01/16 1053) Rubella: 12.00 (08/25 1325) RPR: NON REACTIVE (01/16 1054)  HBsAg: Negative (08/25 1325)  HIV: Non Reactive (10/27 0820)  GBS:   patient declined testing 2 hr Glucola normal Genetic screening  normal horizon testing Anatomy 02-20-1981 normal  Prenatal Transfer Tool  Maternal Diabetes: No Genetic Screening: Normal Maternal Ultrasounds/Referrals: Other: polyhydramnios and LGA Fetal Ultrasounds or other Referrals:  None Maternal Substance Abuse:  No Significant Maternal Medications:  Meds include: Syntroid Significant Maternal Lab Results: Other: GBS unknown  No results found for this or any previous visit (from the past 24 hour(s)).  Patient Active Problem List   Diagnosis Date Noted  Hypothyroidism affecting pregnancy 04/22/2021   Unwanted fertility 04/22/2021   Supervision of high risk pregnancy, antepartum 12/25/2020   Advanced maternal age in multigravida, second trimester 12/25/2020   History of 2 cesarean sections 12/25/2020   Cervical polyp 12/25/2020    Assessment/Plan:  Harlym Gehling is a 41 y.o. [id number] at [redacted]w[redacted]d here for scheduled repeat cesarean section   #Scheduled repeat cesarean section Patient with two prior cesarean sections. Scheduled for repeat CS and BTL. The risks of cesarean section were discussed with the patient including but were not limited to: bleeding which may require transfusion or reoperation;  infection which may require antibiotics; injury to bowel, bladder, ureters or other surrounding organs; injury to the fetus; need for additional procedures including hysterectomy in the event of a life-threatening hemorrhage; placental abnormalities wth subsequent pregnancies, incisional problems, thromboembolic phenomenon and other postoperative/anesthesia complications.   The patient concurred with the proposed plan, giving informed written consent for the procedures.  Patient has been NPO since last night she will remain NPO for procedure. Anesthesia and OR aware.  Preoperative prophylactic antibiotics and SCDs ordered on call to the OR.  To OR when ready.  #Pain: spinal #ID:  Declined GBS testing. Will get pre-op prophylactic antibiotics #MOF: breast and bottle #MOC: unsure #Circ:  N/A (female)   #Hypothyroidism On levothyroxine 137 mcg during pregnancy. Patient was not on any medication immediately prior to pregnancy but was on medication in past.  - Will look into medication dosing and decrease to pre-pregnancy dosing post delivery  Warner Mccreedy, MD, MPH OB Fellow, Faculty Practice

## 2021-05-19 NOTE — Anesthesia Preprocedure Evaluation (Addendum)
Anesthesia Evaluation  Patient identified by MRN, date of birth, ID band Patient awake    Reviewed: Allergy & Precautions, NPO status , Patient's Chart, lab work & pertinent test results  Airway Mallampati: II  TM Distance: >3 FB Neck ROM: Full    Dental no notable dental hx. (+) Teeth Intact, Dental Advisory Given   Pulmonary neg pulmonary ROS,    Pulmonary exam normal breath sounds clear to auscultation       Cardiovascular negative cardio ROS Normal cardiovascular exam Rhythm:Regular Rate:Normal     Neuro/Psych negative neurological ROS  negative psych ROS   GI/Hepatic Neg liver ROS, GERD  ,  Endo/Other  Hypothyroidism Obesity  Renal/GU negative Renal ROS  negative genitourinary   Musculoskeletal negative musculoskeletal ROS (+)   Abdominal (+) + obese,   Peds  Hematology negative hematology ROS (+)   Anesthesia Other Findings   Reproductive/Obstetrics (+) Pregnancy AMA  Hx/o previous C/Sections x 2 Undesired fertility                            Anesthesia Physical Anesthesia Plan  ASA: 2  Anesthesia Plan: Combined Spinal and Epidural and Spinal   Post-op Pain Management:    Induction: Intravenous  PONV Risk Score and Plan: Treatment may vary due to age or medical condition and Scopolamine patch - Pre-op  Airway Management Planned: Natural Airway  Additional Equipment:   Intra-op Plan:   Post-operative Plan:   Informed Consent: I have reviewed the patients History and Physical, chart, labs and discussed the procedure including the risks, benefits and alternatives for the proposed anesthesia with the patient or authorized representative who has indicated his/her understanding and acceptance.     Dental advisory given  Plan Discussed with: CRNA and Anesthesiologist  Anesthesia Plan Comments:        Anesthesia Quick Evaluation

## 2021-05-20 ENCOUNTER — Inpatient Hospital Stay (HOSPITAL_COMMUNITY): Payer: Medicaid Other | Admitting: Anesthesiology

## 2021-05-20 ENCOUNTER — Encounter (HOSPITAL_COMMUNITY): Payer: Self-pay | Admitting: Obstetrics and Gynecology

## 2021-05-20 ENCOUNTER — Other Ambulatory Visit: Payer: Self-pay

## 2021-05-20 ENCOUNTER — Inpatient Hospital Stay (HOSPITAL_COMMUNITY)
Admission: RE | Admit: 2021-05-20 | Discharge: 2021-05-22 | DRG: 788 | Disposition: A | Discharge status: Home / Self Care | Payer: Medicaid Other | Attending: Obstetrics and Gynecology | Admitting: Obstetrics and Gynecology

## 2021-05-20 ENCOUNTER — Encounter (HOSPITAL_COMMUNITY)
Admission: RE | Disposition: A | Discharge status: Home / Self Care | Payer: Self-pay | Source: Home / Self Care | Attending: Obstetrics and Gynecology

## 2021-05-20 DIAGNOSIS — O403XX0 Polyhydramnios, third trimester, not applicable or unspecified: Secondary | ICD-10-CM | POA: Diagnosis present

## 2021-05-20 DIAGNOSIS — E039 Hypothyroidism, unspecified: Secondary | ICD-10-CM | POA: Diagnosis present

## 2021-05-20 DIAGNOSIS — N736 Female pelvic peritoneal adhesions (postinfective): Secondary | ICD-10-CM | POA: Diagnosis present

## 2021-05-20 DIAGNOSIS — O34211 Maternal care for low transverse scar from previous cesarean delivery: Secondary | ICD-10-CM | POA: Diagnosis present

## 2021-05-20 DIAGNOSIS — O3663X0 Maternal care for excessive fetal growth, third trimester, not applicable or unspecified: Secondary | ICD-10-CM | POA: Diagnosis present

## 2021-05-20 DIAGNOSIS — O99892 Other specified diseases and conditions complicating childbirth: Secondary | ICD-10-CM | POA: Diagnosis present

## 2021-05-20 DIAGNOSIS — Z3009 Encounter for other general counseling and advice on contraception: Secondary | ICD-10-CM | POA: Diagnosis present

## 2021-05-20 DIAGNOSIS — O99284 Endocrine, nutritional and metabolic diseases complicating childbirth: Secondary | ICD-10-CM | POA: Diagnosis present

## 2021-05-20 DIAGNOSIS — Z3A38 38 weeks gestation of pregnancy: Secondary | ICD-10-CM

## 2021-05-20 DIAGNOSIS — O3443 Maternal care for other abnormalities of cervix, third trimester: Secondary | ICD-10-CM | POA: Diagnosis present

## 2021-05-20 DIAGNOSIS — O0990 Supervision of high risk pregnancy, unspecified, unspecified trimester: Secondary | ICD-10-CM

## 2021-05-20 DIAGNOSIS — O99214 Obesity complicating childbirth: Secondary | ICD-10-CM | POA: Diagnosis present

## 2021-05-20 DIAGNOSIS — Z98891 History of uterine scar from previous surgery: Secondary | ICD-10-CM

## 2021-05-20 DIAGNOSIS — O09522 Supervision of elderly multigravida, second trimester: Secondary | ICD-10-CM | POA: Diagnosis present

## 2021-05-20 DIAGNOSIS — N841 Polyp of cervix uteri: Secondary | ICD-10-CM | POA: Diagnosis present

## 2021-05-20 HISTORY — DX: History of uterine scar from previous surgery: Z98.891

## 2021-05-20 SURGERY — Surgical Case
Anesthesia: Spinal

## 2021-05-20 MED ORDER — TRANEXAMIC ACID-NACL 1000-0.7 MG/100ML-% IV SOLN
INTRAVENOUS | Status: DC | PRN
  Administered 2021-05-20: 1000 mg via INTRAVENOUS

## 2021-05-20 MED ORDER — OXYTOCIN-SODIUM CHLORIDE 30-0.9 UT/500ML-% IV SOLN
INTRAVENOUS | Status: DC | PRN
  Administered 2021-05-20: 300 mL via INTRAVENOUS

## 2021-05-20 MED ORDER — SODIUM CHLORIDE 0.9 % IV SOLN
6.2500 mg | Freq: Once | INTRAVENOUS | Status: AC
  Administered 2021-05-20: 6.25 mg via INTRAVENOUS
  Filled 2021-05-20: qty 0.25

## 2021-05-20 MED ORDER — DEXAMETHASONE SODIUM PHOSPHATE 10 MG/ML IJ SOLN
INTRAMUSCULAR | Status: AC
  Filled 2021-05-20: qty 1

## 2021-05-20 MED ORDER — ACETAMINOPHEN 10 MG/ML IV SOLN
INTRAVENOUS | Status: AC
  Filled 2021-05-20: qty 100

## 2021-05-20 MED ORDER — KETOROLAC TROMETHAMINE 30 MG/ML IJ SOLN
INTRAMUSCULAR | Status: AC
  Filled 2021-05-20: qty 1

## 2021-05-20 MED ORDER — NALOXONE HCL 4 MG/10ML IJ SOLN
1.0000 ug/kg/h | INTRAVENOUS | Status: DC | PRN
  Filled 2021-05-20: qty 5

## 2021-05-20 MED ORDER — KETOROLAC TROMETHAMINE 30 MG/ML IJ SOLN
30.0000 mg | Freq: Four times a day (QID) | INTRAMUSCULAR | Status: AC | PRN
  Administered 2021-05-20 (×2): 30 mg via INTRAVENOUS
  Filled 2021-05-20: qty 1

## 2021-05-20 MED ORDER — MORPHINE SULFATE (PF) 0.5 MG/ML IJ SOLN
INTRAMUSCULAR | Status: AC
  Filled 2021-05-20: qty 10

## 2021-05-20 MED ORDER — COCONUT OIL OIL: 1.0000 "application " | TOPICAL_OIL | Status: DC | PRN

## 2021-05-20 MED ORDER — WITCH HAZEL-GLYCERIN EX PADS: 1.0000 "application " | MEDICATED_PAD | CUTANEOUS | Status: DC | PRN

## 2021-05-20 MED ORDER — PRENATAL MULTIVITAMIN CH
1.0000 | ORAL_TABLET | Freq: Every day | ORAL | Status: DC
  Administered 2021-05-21 – 2021-05-22 (×2): 1 via ORAL
  Filled 2021-05-20 (×2): qty 1

## 2021-05-20 MED ORDER — MEASLES, MUMPS & RUBELLA VAC IJ SOLR: 0.5000 mL | Freq: Once | INTRAMUSCULAR | Status: DC

## 2021-05-20 MED ORDER — SODIUM CHLORIDE 0.9% FLUSH: 3.0000 mL | INTRAVENOUS | Status: DC | PRN

## 2021-05-20 MED ORDER — DIBUCAINE (PERIANAL) 1 % EX OINT: 1.0000 "application " | TOPICAL_OINTMENT | CUTANEOUS | Status: DC | PRN

## 2021-05-20 MED ORDER — ZOLPIDEM TARTRATE 5 MG PO TABS: 5.0000 mg | ORAL_TABLET | Freq: Every evening | ORAL | Status: DC | PRN

## 2021-05-20 MED ORDER — DIPHENHYDRAMINE HCL 25 MG PO CAPS: 25.0000 mg | ORAL_CAPSULE | ORAL | Status: DC | PRN

## 2021-05-20 MED ORDER — ONDANSETRON HCL 4 MG/2ML IJ SOLN
INTRAMUSCULAR | Status: AC
  Filled 2021-05-20: qty 2

## 2021-05-20 MED ORDER — PHENYLEPHRINE 40 MCG/ML (10ML) SYRINGE FOR IV PUSH (FOR BLOOD PRESSURE SUPPORT)
PREFILLED_SYRINGE | INTRAVENOUS | Status: AC
  Filled 2021-05-20: qty 10

## 2021-05-20 MED ORDER — SCOPOLAMINE 1 MG/3DAYS TD PT72
MEDICATED_PATCH | TRANSDERMAL | Status: AC
  Filled 2021-05-20: qty 1

## 2021-05-20 MED ORDER — FENTANYL CITRATE (PF) 100 MCG/2ML IJ SOLN
INTRAMUSCULAR | Status: AC
  Filled 2021-05-20: qty 2

## 2021-05-20 MED ORDER — ACETAMINOPHEN 10 MG/ML IV SOLN
INTRAVENOUS | Status: DC | PRN
  Administered 2021-05-20: 1000 mg via INTRAVENOUS

## 2021-05-20 MED ORDER — DEXAMETHASONE SODIUM PHOSPHATE 10 MG/ML IJ SOLN
INTRAMUSCULAR | Status: DC | PRN
Start: 2021-05-20 — End: 2021-05-20
  Administered 2021-05-20: 10 mg via INTRAVENOUS

## 2021-05-20 MED ORDER — ONDANSETRON HCL 4 MG/2ML IJ SOLN
INTRAMUSCULAR | Status: DC | PRN
Start: 2021-05-20 — End: 2021-05-20
  Administered 2021-05-20: 4 mg via INTRAVENOUS

## 2021-05-20 MED ORDER — SENNOSIDES-DOCUSATE SODIUM 8.6-50 MG PO TABS
2.0000 | ORAL_TABLET | ORAL | Status: DC
  Administered 2021-05-21: 2 via ORAL
  Filled 2021-05-20: qty 2

## 2021-05-20 MED ORDER — PHENYLEPHRINE HCL-NACL 20-0.9 MG/250ML-% IV SOLN
INTRAVENOUS | Status: AC
  Filled 2021-05-20: qty 250

## 2021-05-20 MED ORDER — KETOROLAC TROMETHAMINE 30 MG/ML IJ SOLN: 30.0000 mg | Freq: Four times a day (QID) | INTRAMUSCULAR | Status: AC | PRN

## 2021-05-20 MED ORDER — LEVOTHYROXINE SODIUM 75 MCG PO TABS
75.0000 ug | ORAL_TABLET | Freq: Every day | ORAL | Status: DC
  Administered 2021-05-21 – 2021-05-22 (×2): 75 ug via ORAL
  Filled 2021-05-20 (×2): qty 1

## 2021-05-20 MED ORDER — MENTHOL 3 MG MT LOZG: 1.0000 | LOZENGE | OROMUCOSAL | Status: DC | PRN

## 2021-05-20 MED ORDER — OXYTOCIN-SODIUM CHLORIDE 30-0.9 UT/500ML-% IV SOLN: 2.5000 [IU]/h | INTRAVENOUS | Status: AC

## 2021-05-20 MED ORDER — SODIUM CHLORIDE 0.9 % IR SOLN
Status: DC | PRN
  Administered 2021-05-20: 1

## 2021-05-20 MED ORDER — TRANEXAMIC ACID-NACL 1000-0.7 MG/100ML-% IV SOLN
INTRAVENOUS | Status: AC
  Filled 2021-05-20: qty 100

## 2021-05-20 MED ORDER — CEFAZOLIN SODIUM-DEXTROSE 2-3 GM-%(50ML) IV SOLR
INTRAVENOUS | Status: DC | PRN
  Administered 2021-05-20: 2 g via INTRAVENOUS

## 2021-05-20 MED ORDER — DEXAMETHASONE SODIUM PHOSPHATE 4 MG/ML IJ SOLN
INTRAMUSCULAR | Status: AC
  Filled 2021-05-20: qty 2

## 2021-05-20 MED ORDER — SIMETHICONE 80 MG PO CHEW: 80.0000 mg | CHEWABLE_TABLET | ORAL | Status: DC | PRN

## 2021-05-20 MED ORDER — OXYTOCIN-SODIUM CHLORIDE 30-0.9 UT/500ML-% IV SOLN
INTRAVENOUS | Status: AC
  Filled 2021-05-20: qty 500

## 2021-05-20 MED ORDER — STERILE WATER FOR IRRIGATION IR SOLN
Status: DC | PRN
  Administered 2021-05-20: 1

## 2021-05-20 MED ORDER — LACTATED RINGERS IV SOLN: INTRAVENOUS | Status: DC

## 2021-05-20 MED ORDER — NALOXONE HCL 0.4 MG/ML IJ SOLN: 0.4000 mg | INTRAMUSCULAR | Status: DC | PRN

## 2021-05-20 MED ORDER — CEFAZOLIN SODIUM-DEXTROSE 2-4 GM/100ML-% IV SOLN
INTRAVENOUS | Status: AC
  Filled 2021-05-20: qty 100

## 2021-05-20 MED ORDER — PROMETHAZINE HCL 25 MG/ML IJ SOLN
INTRAMUSCULAR | Status: AC
  Filled 2021-05-20: qty 1

## 2021-05-20 MED ORDER — SIMETHICONE 80 MG PO CHEW
80.0000 mg | CHEWABLE_TABLET | Freq: Three times a day (TID) | ORAL | Status: DC
  Administered 2021-05-21 – 2021-05-22 (×4): 80 mg via ORAL
  Filled 2021-05-20 (×4): qty 1

## 2021-05-20 MED ORDER — BUPIVACAINE IN DEXTROSE 0.75-8.25 % IT SOLN
INTRATHECAL | Status: DC | PRN
  Administered 2021-05-20: 1.6 mL via INTRATHECAL

## 2021-05-20 MED ORDER — ONDANSETRON HCL 4 MG/2ML IJ SOLN
4.0000 mg | Freq: Three times a day (TID) | INTRAMUSCULAR | Status: DC | PRN
  Administered 2021-05-20: 4 mg via INTRAVENOUS

## 2021-05-20 MED ORDER — IBUPROFEN 600 MG PO TABS
600.0000 mg | ORAL_TABLET | Freq: Four times a day (QID) | ORAL | Status: DC
  Administered 2021-05-21 – 2021-05-22 (×7): 600 mg via ORAL
  Filled 2021-05-20 (×7): qty 1

## 2021-05-20 MED ORDER — FENTANYL CITRATE (PF) 100 MCG/2ML IJ SOLN
INTRAMUSCULAR | Status: DC | PRN
  Administered 2021-05-20: 15 ug via INTRATHECAL

## 2021-05-20 MED ORDER — DIPHENHYDRAMINE HCL 25 MG PO CAPS: 25.0000 mg | ORAL_CAPSULE | Freq: Four times a day (QID) | ORAL | Status: DC | PRN

## 2021-05-20 MED ORDER — MORPHINE SULFATE (PF) 0.5 MG/ML IJ SOLN
INTRAMUSCULAR | Status: DC | PRN
  Administered 2021-05-20: .15 mg via INTRATHECAL

## 2021-05-20 MED ORDER — POVIDONE-IODINE 10 % EX SWAB
2.0000 "application " | Freq: Once | CUTANEOUS | Status: AC
  Administered 2021-05-20: 2 via TOPICAL

## 2021-05-20 MED ORDER — PHENYLEPHRINE HCL-NACL 20-0.9 MG/250ML-% IV SOLN
INTRAVENOUS | Status: DC | PRN
  Administered 2021-05-20: 60 ug/min via INTRAVENOUS

## 2021-05-20 MED ORDER — SCOPOLAMINE 1 MG/3DAYS TD PT72
1.0000 | MEDICATED_PATCH | TRANSDERMAL | Status: DC
  Administered 2021-05-20: 1.5 mg via TRANSDERMAL

## 2021-05-20 MED ORDER — FENTANYL CITRATE (PF) 100 MCG/2ML IJ SOLN: 25.0000 ug | INTRAMUSCULAR | Status: DC | PRN

## 2021-05-20 MED ORDER — OXYCODONE HCL 5 MG PO TABS: 5.0000 mg | ORAL_TABLET | ORAL | Status: DC | PRN

## 2021-05-20 MED ORDER — CEFAZOLIN SODIUM-DEXTROSE 2-4 GM/100ML-% IV SOLN: 2.0000 g | INTRAVENOUS | Status: DC

## 2021-05-20 MED ORDER — ENOXAPARIN SODIUM 40 MG/0.4ML IJ SOSY
40.0000 mg | PREFILLED_SYRINGE | INTRAMUSCULAR | Status: DC
  Administered 2021-05-21 – 2021-05-22 (×2): 40 mg via SUBCUTANEOUS
  Filled 2021-05-20 (×2): qty 0.4

## 2021-05-20 MED ORDER — DIPHENHYDRAMINE HCL 50 MG/ML IJ SOLN: 12.5000 mg | INTRAMUSCULAR | Status: DC | PRN

## 2021-05-20 MED ORDER — ACETAMINOPHEN 500 MG PO TABS
1000.0000 mg | ORAL_TABLET | Freq: Four times a day (QID) | ORAL | Status: DC
  Administered 2021-05-21 – 2021-05-22 (×6): 1000 mg via ORAL
  Filled 2021-05-20 (×8): qty 2

## 2021-05-20 MED ORDER — MEPERIDINE HCL 25 MG/ML IJ SOLN: 6.2500 mg | INTRAMUSCULAR | Status: DC | PRN

## 2021-05-20 MED ORDER — METOCLOPRAMIDE HCL 5 MG/ML IJ SOLN
INTRAMUSCULAR | Status: AC
  Filled 2021-05-20: qty 2

## 2021-05-20 SURGICAL SUPPLY — 34 items
BENZOIN TINCTURE PRP APPL 2/3 (GAUZE/BANDAGES/DRESSINGS) ×2 IMPLANT
CHLORAPREP W/TINT 26ML (MISCELLANEOUS) ×2 IMPLANT
CLAMP CORD UMBIL (MISCELLANEOUS) IMPLANT
CLOSURE STERI STRIP 1/2 X4 (GAUZE/BANDAGES/DRESSINGS) ×1 IMPLANT
CLOTH BEACON ORANGE TIMEOUT ST (SAFETY) ×2 IMPLANT
DRSG OPSITE POSTOP 4X10 (GAUZE/BANDAGES/DRESSINGS) ×2 IMPLANT
ELECT REM PT RETURN 9FT ADLT (ELECTROSURGICAL) ×2
ELECTRODE REM PT RTRN 9FT ADLT (ELECTROSURGICAL) ×1 IMPLANT
EXTRACTOR VACUUM M CUP 4 TUBE (SUCTIONS) IMPLANT
GLOVE BIOGEL PI IND STRL 7.0 (GLOVE) ×2 IMPLANT
GLOVE BIOGEL PI IND STRL 7.5 (GLOVE) ×2 IMPLANT
GLOVE BIOGEL PI INDICATOR 7.0 (GLOVE) ×2
GLOVE BIOGEL PI INDICATOR 7.5 (GLOVE) ×2
GLOVE ECLIPSE 7.5 STRL STRAW (GLOVE) ×2 IMPLANT
GOWN STRL REUS W/TWL LRG LVL3 (GOWN DISPOSABLE) ×6 IMPLANT
HEMOSTAT ARISTA ABSORB 3G PWDR (HEMOSTASIS) ×1 IMPLANT
KIT ABG SYR 3ML LUER SLIP (SYRINGE) IMPLANT
NDL HYPO 25X5/8 SAFETYGLIDE (NEEDLE) IMPLANT
NEEDLE HYPO 25X5/8 SAFETYGLIDE (NEEDLE) IMPLANT
NS IRRIG 1000ML POUR BTL (IV SOLUTION) ×2 IMPLANT
PACK C SECTION WH (CUSTOM PROCEDURE TRAY) ×2 IMPLANT
PAD OB MATERNITY 4.3X12.25 (PERSONAL CARE ITEMS) ×2 IMPLANT
RTRCTR C-SECT PINK 25CM LRG (MISCELLANEOUS) ×2 IMPLANT
STRIP CLOSURE SKIN 1/2X4 (GAUZE/BANDAGES/DRESSINGS) ×2 IMPLANT
SUT PLAIN 2 0 XLH (SUTURE) ×1 IMPLANT
SUT VIC AB 0 CT1 36 (SUTURE) ×2 IMPLANT
SUT VIC AB 0 CTX 36 (SUTURE) ×2
SUT VIC AB 0 CTX36XBRD ANBCTRL (SUTURE) ×2 IMPLANT
SUT VIC AB 2-0 CT1 27 (SUTURE) ×1
SUT VIC AB 2-0 CT1 TAPERPNT 27 (SUTURE) ×1 IMPLANT
SUT VIC AB 4-0 KS 27 (SUTURE) ×2 IMPLANT
TOWEL OR 17X24 6PK STRL BLUE (TOWEL DISPOSABLE) ×2 IMPLANT
TRAY FOLEY W/BAG SLVR 14FR LF (SET/KITS/TRAYS/PACK) ×2 IMPLANT
WATER STERILE IRR 1000ML POUR (IV SOLUTION) ×2 IMPLANT

## 2021-05-20 NOTE — Discharge Summary (Addendum)
Postpartum Discharge Summary     Patient Name: Christine Page DOB: 01-20-1981 MRN: [id number]  Date of admission: 05/20/2021 Delivery date:05/20/2021  Delivering provider: Renard Matter  Date of discharge: 05/22/2021  Admitting diagnosis: History of 2 cesarean sections [Z98.891] Intrauterine pregnancy: [redacted]w[redacted]d    Secondary diagnosis:  Principal Problem:   History of 3 cesarean sections Active Problems:   Supervision of high risk pregnancy, antepartum   Advanced maternal age in multigravida, second trimester   Cervical polyp   Hypothyroidism affecting pregnancy   Unwanted fertility   Status post cesarean section  Additional problems: None    Discharge diagnosis: Term Pregnancy Delivered                                              Post partum procedures: None Augmentation: N/A Complications: None  Hospital course: Sceduled C/S   41y.o. yo [id number] admitted to the hospital 05/20/2021 for scheduled cesarean section with the following indication: scheduled cesarean section .Delivery details are as follows:  Membrane Rupture Time/Date: 10:15 AM ,05/20/2021   Delivery Method:C-Section, Low Transverse  Details of operation can be found in separate operative note.  Patient had an uncomplicated postpartum course.  She is ambulating, tolerating a regular diet, passing flatus, and urinating well. Patient is discharged home in stable condition on  05/22/21        Newborn Data: Birth date:05/20/2021  Birth time:10:16 AM  Gender:Female  Living status:Living  Apgars:7 ,8  [id number]     Magnesium Sulfate received: No BMZ received: No Rhophylac:N/A MMR:N/A T-DaP: declined Flu: declined Transfusion:No  Physical exam  Vitals:   05/21/21 0255 05/21/21 1600 05/21/21 2000 05/22/21 0525  BP: 102/66 (!) 97/55 90/62 (!) 92/56  Pulse: 62 70 72 63  Resp:   16 16  Temp:  98.2 F (36.8 C) 98.3 F (36.8 C) 97.8 F (36.6 C)  TempSrc:  Oral Oral Oral  SpO2:  99%  100%   Weight:      Height:       General: alert Lochia: appropriate Uterine Fundus: firm Incision: Dressing is clean, dry, and intact DVT Evaluation: No evidence of DVT seen on physical exam. Labs: Lab Results  Component Value Date   WBC 3.8 (L) 05/18/2021   HGB 10.2 (L) 05/21/2021   HCT 36.3 05/18/2021   MCV 91.4 05/18/2021   PLT 194 05/18/2021   CMP Latest Ref Rng & Units 03/11/2021  Glucose 70 - 99 mg/dL -  BUN 6 - 20 mg/dL -  Creatinine 0.57 - 1.00 mg/dL 0.36(L)  Sodium 135 - 145 mmol/L -  Potassium 3.5 - 5.1 mmol/L -  Chloride 98 - 111 mmol/L -  CO2 22 - 32 mmol/L -  Calcium 8.9 - 10.3 mg/dL -  Total Protein 6.5 - 8.1 g/dL -  Total Bilirubin 0.3 - 1.2 mg/dL -  Alkaline Phos 38 - 126 U/L -  AST 0 - 40 IU/L 15  ALT 0 - 32 IU/L 7   Edinburgh Score: Edinburgh Postnatal Depression Scale Screening Tool 05/21/2021  I have been able to laugh and see the funny side of things. 0  I have looked forward with enjoyment to things. 0  I have blamed myself unnecessarily when things went wrong. 0  I have been anxious or worried for no good reason. 0  I have  felt scared or panicky for no good reason. 0  Things have been getting on top of me. 0  I have been so unhappy that I have had difficulty sleeping. 0  I have felt sad or miserable. 0  I have been so unhappy that I have been crying. 0  The thought of harming myself has occurred to me. 0  Edinburgh Postnatal Depression Scale Total 0     After visit meds:  Allergies as of 05/22/2021   No Known Allergies      Medication List     STOP taking these medications    Blood Pressure Kit Devi   PrePLUS 27-1 MG Tabs   Wrist Brace/Left Small Misc   Wrist Brace/Right Small Misc       TAKE these medications    acetaminophen 500 MG tablet Commonly known as: TYLENOL Take 2 tablets (1,000 mg total) by mouth every 6 (six) hours.   ibuprofen 600 MG tablet Commonly known as: ADVIL Take 1 tablet (600 mg total) by mouth  every 6 (six) hours.   levothyroxine 75 MCG tablet Commonly known as: SYNTHROID Take 1 tablet (75 mcg total) by mouth daily at 6 (six) AM. What changed:  medication strength how much to take when to take this   oxyCODONE 5 MG immediate release tablet Commonly known as: Oxy IR/ROXICODONE Take 1 tablet (5 mg total) by mouth every 6 (six) hours as needed for up to 5 days for moderate pain.         Discharge home in stable condition Infant Feeding: Bottle and Breast Infant Disposition:home with mother Discharge instruction: per After Visit Summary and Postpartum booklet. Activity: Advance as tolerated. Pelvic rest for 6 weeks.  Diet: routine diet Future Appointments: Future Appointments  Date Time Provider [place]  05/29/2021 11:15 AM Woodroe Mode, MD [place] None  06/19/2021 10:55 AM Chancy Milroy, MD [place] None   Follow up Visit:  [place]. Go on 05/29/2021.   Specialty: Obstetrics and Gynecology Why: for incision check Contact information: [address] [place] [phone]               Message sent to [facility] by Dr. Cy Blamer on 05/20/2021  Please schedule this patient for a In person postpartum visit in 4 weeks with the following provider: Any provider. Additional Postpartum F/U:Incision check 1 week  Low risk pregnancy complicated by:  hypothyroidism Delivery mode:  C-Section, Low Transverse  Anticipated Birth Control:  considering outpt Nexplanon but unsure  Renard Matter, MD, MPH OB Fellow, Faculty Practice

## 2021-05-20 NOTE — Anesthesia Procedure Notes (Signed)
Spinal  Patient location during procedure: OR Start time: 05/20/2021 9:40 AM End time: 05/20/2021 9:43 AM Reason for block: surgical anesthesia Staffing Performed: anesthesiologist  Anesthesiologist: Mal Amabile, MD Preanesthetic Checklist Completed: patient identified, IV checked, site marked, risks and benefits discussed, surgical consent, monitors and equipment checked, pre-op evaluation and timeout performed Spinal Block Patient position: sitting Prep: DuraPrep and site prepped and draped Patient monitoring: heart rate, cardiac monitor, continuous pulse ox and blood pressure Approach: midline Location: L3-4 Injection technique: single-shot Needle Needle type: Pencan  Needle gauge: 24 G Needle length: 9 cm Needle insertion depth: 6.5 cm Assessment Sensory level: T4 Events: CSF return Additional Notes Patient tolerated procedure well. Adequate sensory level.

## 2021-05-20 NOTE — Op Note (Addendum)
Cesarean Section Operative Report  Christine Page  05/20/2021  Indications:  Scheduled repeat Cesarean Section    Pre-operative Diagnosis: REPEAT CESAREAN SECTION. Hypothyroidism  Post-operative Diagnosis: Same   Surgeon: Surgeon(s) and Role:    * Maelyn Berrey, Wilfred Curtis, MD - Primary    * Warner Mccreedy, MD - Assisting    Anesthesia: spinal    Estimated Blood Loss: 502 ml  Total IV Fluids: 2400 ml LR  Urine Output:: 600 ml clear urine  Specimens: None  Findings: Viable female infant in vertex presentation; Apgars 7,8; weight 3544 g;  clear amniotic fluid; intact placenta with three vessel cord; normal uterus, fallopian tubes and ovaries bilaterally.  Baby condition / location:  NICU - for secretions and needing CPAP support  Complications:  None . Mild/moderate adhesive disease anterior to the rectus. Significant adhesions to the lower uterine segment.  Indications: Christine Page is a 41 y.o. [phone] with an IUP [redacted]w[redacted]d presenting scheduled repeat cesarean section with history of 2 prior cesarean sections.  The risks, benefits, complications, treatment options, and exected outcomes were discussed with the patient . The patient dwith the proposed plan, giving informed consent. identified as [employer] and the procedure verified as C-Section Delivery.  Procedure Details:  The patient was taken back to the operative suite where spinal anesthesia was placed.  A time out was held and the above information confirmed.   After induction of anesthesia, the patient was draped and prepped in the usual sterile manner and placed in a dorsal supine position with a leftward tilt. A Pfannenstiel incision was made at superior end of prior cesarean section scar and carried down through the subcutaneous tissue to the fascia. Fascial incision was made and sharply extended transversely. The fascia was separated from the underlying rectus tissue superiorly and  inferiorly. The peritoneum was identified and sharply entered and extended longitudinally. At that time extensive adhesions noted on uterus and bladder found to be adhered  to the uterus. A bladder flap was created and bladder was easily brought down. Alexis retractor was placed.  A low transverse uterine incision was made carefully while avoiding adhesions and extended bluntly. Delivered from cephalic presentation was a viable infant with Apgars and weight as above.  After waiting 60 seconds for delayed cord cutting, the umbilical cord was clamped and cut cord blood was obtained for evaluation. Cord ph was not sent. The placenta was removed Intact and appeared normal. The uterine outline appeared normal. The uterine incision was closed with running locked sutures of 0Vicryl with an imbricating layer of the same.  At that time there were areas of small amounts of bleeding noted at the previous adhesions as well as parts of the hysterotomy that were cauterized. Patient was also given TXA. This mostly controlled the bleeding however due to small amounts of continued oozing Arista was applied to entire hysterotomy and areas of adhesion. Hemostasis was observed. The rectus muscles were examined and hemostasis observed. The fascia was then reapproximated with running sutures of 0Vicryl.  The subcuticular closure was performed using 2plain gut. The skin was closed with 4-0Vicryl.   Instrument, sponge, and needle counts were correct prior the abdominal closure and were correct at the conclusion of the case.     Disposition: PACU - hemodynamically stable.   Maternal Condition: stable       Signed: Anuka DasMD 05/20/2021 12:04 PM

## 2021-05-20 NOTE — Transfer of Care (Signed)
Immediate Anesthesia Transfer of Care Note  Patient: Christine Page  Procedure(s) Performed: CESAREAN SECTION  Patient Location: PACU  Anesthesia Type:Spinal  Level of Consciousness: awake  Airway & Oxygen Therapy: Patient Spontanous Breathing  Post-op Assessment: Report given to RN and Post -op Vital signs reviewed and stable  Post vital signs: Reviewed and stable  Last Vitals:  Vitals Value Taken Time  BP    Temp    Pulse    Resp    SpO2      Last Pain:  Vitals:   05/20/21 0732  TempSrc: Oral  PainSc: 0-No pain         Complications: No notable events documented.

## 2021-05-20 NOTE — Anesthesia Postprocedure Evaluation (Signed)
Anesthesia Post Note  Patient: Photographer  Procedure(s) Performed: CESAREAN SECTION     Patient location during evaluation: PACU Anesthesia Type: Spinal Level of consciousness: oriented and awake and alert Pain management: pain level controlled Vital Signs Assessment: post-procedure vital signs reviewed and stable Respiratory status: spontaneous breathing, respiratory function stable and nonlabored ventilation Cardiovascular status: blood pressure returned to baseline and stable Postop Assessment: no headache, no backache, no apparent nausea or vomiting, spinal receding and patient able to bend at knees Anesthetic complications: no   No notable events documented.  Last Vitals:  Vitals:   05/20/21 1200 05/20/21 1247  BP: 100/63 116/72  Pulse: 62 73  Resp: 14 18  Temp:  36.5 C  SpO2: 99% 94%    Last Pain:  Vitals:   05/20/21 1247  TempSrc:   PainSc: Asleep   Pain Goal:                   [name]

## 2021-05-20 NOTE — Lactation Note (Signed)
This note was copied from a baby's chart. Lactation Consultation Note  Patient Name: Christine Page Today's Date: 05/20/2021 Reason for consult: Initial assessment;Term;Maternal endocrine disorder Age:41 hours P3, term female infant. Infant was latched on mom's left breast for 15 minnutes as LC entered the room. Mom switched infant on her right breast using the football hold, infant was still breastfeeding after 20 minutes when LC left the room. Mom will try latch infant on both breast during a feeding. Mom knows to breastfeed infant according to hunger cues, 8 to 12+ or more times within 24 hours, skin to skin. Mom knows to call RN/LC if she has any breastfeeding questions, concerns or need assistance with latching infant at the breast. Mom made aware of O/P services, breastfeeding support groups, community resources, and our phone # for post-discharge questions.   Maternal Data Has patient been taught Hand Expression?: Yes Does the patient have breastfeeding experience prior to this delivery?: Yes How long did the patient breastfeed?: Per mom, she BF 1st child for 15 months and her 2nd child who is almost 2 years for 18 months.  Feeding Mother's Current Feeding Choice: Breast Milk and Formula  LATCH Score Latch: Grasps breast easily, tongue down, lips flanged, rhythmical sucking.  Audible Swallowing: Spontaneous and intermittent  Type of Nipple: Everted at rest and after stimulation  Comfort (Breast/Nipple): Soft / non-tender  Hold (Positioning): Assistance needed to correctly position infant at breast and maintain latch.  LATCH Score: 9   Lactation Tools Discussed/Used    Interventions Interventions: Breast feeding basics reviewed;Assisted with latch;Skin to skin;Breast compression;Adjust position;Support pillows;Position options;Education;LC Services brochure  Discharge    Consult Status Consult Status: Follow-up Date: 05/21/21 Follow-up type:  In-patient    Danelle Earthly 05/20/2021, 8:56 PM

## 2021-05-21 ENCOUNTER — Encounter (HOSPITAL_COMMUNITY): Payer: Self-pay | Admitting: Obstetrics and Gynecology

## 2021-05-21 LAB — HEMOGLOBIN: Hemoglobin: 10.2 g/dL — ABNORMAL LOW (ref 12.0–15.0)

## 2021-05-21 NOTE — Lactation Note (Signed)
This note was copied from a baby's chart. Lactation Consultation Note  Patient Name: Christine Page Date: 05/21/2021 Reason for consult: Follow-up assessment;Term (-6% weight loss) Age:41 hours Per mom, infant did not breastfeed well earlier today was doing a lot of sleeping mom made few attempts infant went longer than 5 hours without feeding.  LC reviewed with mom to breastfeed infant without blankets skin to skin and if she needs help with waking or latching infant to call RN/LC for latch assistance. Per mom, since 1600 pm infant is more alert and starting to breast feed better.  Mom latched infant on her left breast using a lying side position, infant was latched with depth and actively feeding, infant was still breastfeeding after 10 minutes when LC left the room. Mom knows to do breast stimulation techniques to keep infant awake while breastfeeding: skin to skin, breast compressions, gently stroking neck and shoulder and breastfeeding infant on both breast during a feeding. Mom knows she can hand express and she can express colostrum and spoon fed to infant after latching infant at the breast.  LC encourage mom to breastfeed infant according to hunger cues, and to wake infant to breastfeed if longer than 31/2 hours and ask for assistance if needed.  Maternal Data    Feeding Mother's Current Feeding Choice: Breast Milk and Formula  LATCH Score Latch: Grasps breast easily, tongue down, lips flanged, rhythmical sucking.  Audible Swallowing: Spontaneous and intermittent  Type of Nipple: Everted at rest and after stimulation  Comfort (Breast/Nipple): Soft / non-tender  Hold (Positioning): Assistance needed to correctly position infant at breast and maintain latch.  LATCH Score: 9   Lactation Tools Discussed/Used    Interventions Interventions: Position options;Breast compression;Skin to skin;Assisted with latch  Discharge    Consult Status Consult Status:  Follow-up Date: 05/22/21 Follow-up type: In-patient    Danelle Earthly 05/21/2021, 10:07 PM

## 2021-05-21 NOTE — Progress Notes (Signed)
Subjective: Postpartum Day 1: Cesarean Delivery Patient reports incisional pain, tolerating PO, + flatus, and + BM.    Objective: Vital signs in last 24 hours: Temp:  [97.6 F (36.4 C)-98.6 F (37 C)] 98.6 F (37 C) (01/18 2130) Pulse Rate:  [59-75] 62 (01/19 0255) Resp:  [14-23] 16 (01/18 2130) BP: (91-116)/(58-72) 102/66 (01/19 0255) SpO2:  [94 %-100 %] 99 % (01/18 2130) Weight:  [73.5 kg] 73.5 kg (01/18 0746)  Physical Exam:  General: alert, cooperative, and no distress Lochia: appropriate Uterine Fundus: firm Incision: healing well, no significant drainage DVT Evaluation: No evidence of DVT seen on physical exam.  Recent Labs    05/18/21 1054 05/21/21 0503  HGB 12.2 10.2*  HCT 36.3  --     Assessment/Plan: Status post Cesarean section. Doing well postoperatively.  Continue current care.  Wynelle Bourgeois 05/21/2021, 6:31 AM

## 2021-05-21 NOTE — Progress Notes (Signed)
Patient ID: Christine Page, female   DOB: Jan 01, 1981, 41 y.o.   MRN: [id number]  POSTPARTUM PROGRESS NOTE  Post Op Day 1  Subjective:  Thao Bauza is a 41 y.o. [phone] s/p rLTCS at [redacted]w[redacted]d.  No acute events overnight.  Pt denies problems with ambulating, voiding or po intake.  She denies nausea or vomiting.  Pain is well controlled.  She has had flatus. She has had bowel movement.  Lochia Minimal.   Objective: Blood pressure 102/66, pulse 62, temperature 98.6 F (37 C), temperature source Axillary, resp. rate 16, height 5' 1.42" (1.56 m), weight 73.5 kg, last menstrual period 08/15/2020, SpO2 99 %, unknown if currently breastfeeding.  Physical Exam:  General: alert, cooperative and no distress Chest: no respiratory distress Heart: regular rate, distal pulses intact Abdomen: soft, non tender DVT Evaluation: No calf swelling or tenderness Extremities: No edema, SCDs in place Skin: warm, dry; incision clean/dry/intact  Recent Labs    05/18/21 1054 05/21/21 0503  HGB 12.2 10.2*  HCT 36.3  --     Assessment/Plan: Cathlyn Tersigni is a 41 y.o. [id number] s/p rLTCS at [redacted]w[redacted]d   POD#1 - Doing well Contraception: undecided Feeding: breastmilk and formula Dispo: Plan for discharge 1/20 or 1/21.   LOS: 1 day   Damita Dunnings, Medical Student 05/21/2021, 7:39 AM

## 2021-05-22 ENCOUNTER — Other Ambulatory Visit (HOSPITAL_COMMUNITY): Payer: Self-pay

## 2021-05-22 MED ORDER — ACETAMINOPHEN 500 MG PO TABS
1000.0000 mg | ORAL_TABLET | Freq: Four times a day (QID) | ORAL | 0 refills | Status: AC
  Filled 2021-05-22: qty 30, 4d supply, fill #0

## 2021-05-22 MED ORDER — OXYCODONE HCL 5 MG PO TABS
5.0000 mg | ORAL_TABLET | Freq: Four times a day (QID) | ORAL | 0 refills | Status: AC | PRN
  Filled 2021-05-22: qty 10, 3d supply, fill #0

## 2021-05-22 MED ORDER — IBUPROFEN 600 MG PO TABS
600.0000 mg | ORAL_TABLET | Freq: Four times a day (QID) | ORAL | 0 refills | Status: AC
  Filled 2021-05-22: qty 30, 8d supply, fill #0

## 2021-05-22 MED ORDER — LEVOTHYROXINE SODIUM 75 MCG PO TABS
75.0000 ug | ORAL_TABLET | Freq: Every day | ORAL | 0 refills | Status: AC
  Filled 2021-05-22: qty 60, 60d supply, fill #0

## 2021-05-29 ENCOUNTER — Ambulatory Visit (INDEPENDENT_AMBULATORY_CARE_PROVIDER_SITE_OTHER): Payer: Medicaid Other | Admitting: Obstetrics & Gynecology

## 2021-05-29 ENCOUNTER — Other Ambulatory Visit: Payer: Self-pay

## 2021-05-29 VITALS — BP 109/73 | HR 64 | Wt 149.0 lb

## 2021-05-29 DIAGNOSIS — Z98891 History of uterine scar from previous surgery: Secondary | ICD-10-CM

## 2021-05-29 LAB — POCT URINALYSIS DIPSTICK
Bilirubin, UA: NEGATIVE
Glucose, UA: NEGATIVE
Ketones, UA: NEGATIVE
Nitrite, UA: NEGATIVE
Protein, UA: NEGATIVE
Spec Grav, UA: 1.01 (ref 1.010–1.025)
Urobilinogen, UA: 0.2 E.U./dL
pH, UA: 6 (ref 5.0–8.0)

## 2021-05-29 NOTE — Progress Notes (Signed)
Subjective:     Azelie Noguera is a 41 y.o. female who presents to the clinic 1 weeks status post  repeat cesarean section  for  elective repeat . Eating a regular diet without difficulty. Bowel movements are normal. Pain is controlled without any medications.  The following portions of the patient's history were reviewed and updated as appropriate: allergies, current medications, past family history, past medical history, past social history, past surgical history, and problem list.  Review of Systems Genitourinary:positive for dysuria    Objective:    BP 109/73    Pulse 64    Wt 149 lb (67.6 kg)    LMP 08/15/2020 (Within Days)    BMI 27.77 kg/m  General:  alert, cooperative, and no distress  Abdomen: soft, bowel sounds active, non-tender  Incision:   healing well, no drainage, no erythema, no hernia, no seroma, no swelling, no dehiscence, incision well approximated     Assessment:    Doing well postoperatively. Operative findings again reviewed. Pathology report discussed.   Urine dipstick shows positive for leukocytes trace  Micro exam: not done.  Plan:    1. Continue any current medications. 2. Wound care discussed. 3. Activity restrictions: no lifting more than 15 pounds 4. Anticipated return to work: not applicable. 5. Follow up: 3 weeks.   Adam Phenix, MD 05/29/2021

## 2021-05-29 NOTE — Progress Notes (Signed)
Pt is 1 week post c/s here today for incision check. Pt complains of still having Carpal tunnel symptoms in hands. EPDS score 0 today.

## 2021-06-19 ENCOUNTER — Ambulatory Visit (INDEPENDENT_AMBULATORY_CARE_PROVIDER_SITE_OTHER): Payer: Medicaid Other | Admitting: Obstetrics and Gynecology

## 2021-06-19 ENCOUNTER — Encounter: Payer: Self-pay | Admitting: Obstetrics and Gynecology

## 2021-06-19 ENCOUNTER — Other Ambulatory Visit: Payer: Self-pay

## 2021-06-19 NOTE — Progress Notes (Signed)
° ° °  Post Partum Visit Note  Christine Page is a 41 y.o. [phone] female who presents for a postpartum visit. She is 4 week postpartum following a repeat cesarean section.  I have fully reviewed the prenatal and intrapartum course. The delivery was at 39 gestational weeks.  Anesthesia: spinal. Postpartum course has been uncomplicated. Baby is doing well. Baby is feeding by both breast and bottle - Similac Sensitive RS. Bleeding staining only. Bowel function is normal. Bladder function is normal. Patient is not sexually active. Contraception method is none. Postpartum depression screening: negative.   The pregnancy intention screening data noted above was reviewed. Potential methods of contraception were discussed. The patient elected to proceed with No data recorded.    There are no preventive care reminders to display for this patient.  Medical record  Review of Systems Pertinent items noted in HPI and remainder of comprehensive ROS otherwise negative.  Objective:  There were no vitals taken for this visit.   General:  alert   Breasts:  not indicated  Lungs: clear to auscultation bilaterally  Heart:  regular rate and rhythm, S1, S2 normal, no murmur, click, rub or gallop  Abdomen: soft, non-tender; bowel sounds normal; no masses,  no organomegaly   Wound well approximated incision  GU exam:  not indicated       Assessment:    There are no diagnoses linked to this encounter.  NL postpartum exam.   Plan:   Essential components of care per ACOG recommendations:  1.  Mood and well being: Patient with negative depression screening today. Reviewed local resources for support.  - Patient tobacco use? No.   - hx of drug use? No.    2. Infant care and feeding:  -Patient currently breastmilk feeding? Yes -Social determinants of health (SDOH) reviewed in EPIC. No concerns  3. Sexuality, contraception and birth spacing - Patient does not want a pregnancy in the next  year.  Desired family size is uncertain  - Reviewed forms of contraception in tiered fashion. Patient desired  undecided, information on IUD and Nexplanon provided  today.   - Discussed birth spacing of 18 months  4. Sleep and fatigue -Encouraged family/partner/community support of 4 hrs of uninterrupted sleep to help with mood and fatigue  5. Physical Recovery  - Discussed patients delivery and complications. She describes her labor as good. - Patient had a C-section. P- Patient has urinary incontinence? No. - Patient is safe to resume physical and sexual activity  6.  Health Maintenance - HM due items addressed Yes - Last pap smear  Diagnosis  Date Value Ref Range Status  12/25/2020   Final   - Negative for intraepithelial lesion or malignancy (NILM)   Pap smear not done at today's visit.  -Breast Cancer screening indicated? No.   7. Chronic Disease/Pregnancy Condition follow up: None  - PCP follow up  Nettie Elm, MD [facility]

## 2021-06-19 NOTE — Progress Notes (Signed)
Patient presents for PP visit. Reports some drainage from C-section incision. No other concerns. Desires more information about nexplanon, pamphlet given.

## 2021-06-19 NOTE — Patient Instructions (Signed)

## 2022-01-28 ENCOUNTER — Other Ambulatory Visit: Payer: Self-pay | Admitting: Nurse Practitioner

## 2022-01-28 DIAGNOSIS — R1030 Lower abdominal pain, unspecified: Secondary | ICD-10-CM

## 2022-02-15 IMAGING — US US OB < 14 WEEKS - US OB TV
1 series · 15 of 28 positions shown · non-contrast
Comparison: None.

CLINICAL DATA: Abdominal pain

EXAM:
OBSTETRIC <14 WK US AND TRANSVAGINAL OB US
TECHNIQUE: Both transabdominal and transvaginal ultrasound examinations were
performed for complete evaluation of the gestation as well as the
maternal uterus, adnexal regions, and pelvic cul-de-sac.
Transvaginal technique was performed to assess early pregnancy.

[Series 1: us ob < 14 weeks - us ob tv · 15 of 49 slices shown]
[im 1/49]
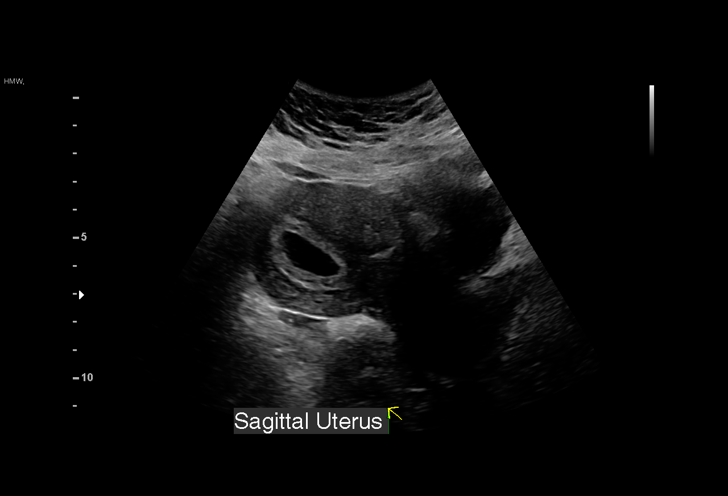
[im 4/49]
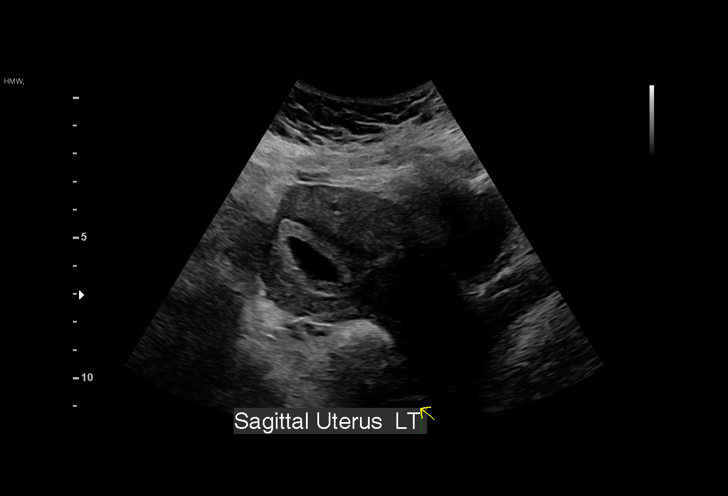
[im 8/49]
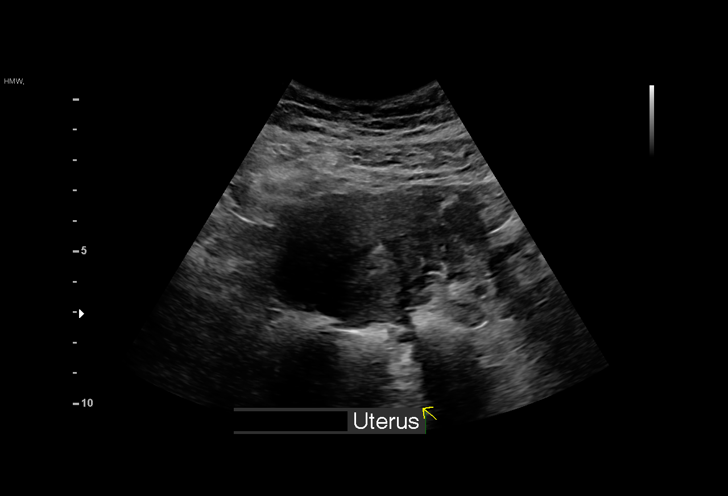
[im 11/49]
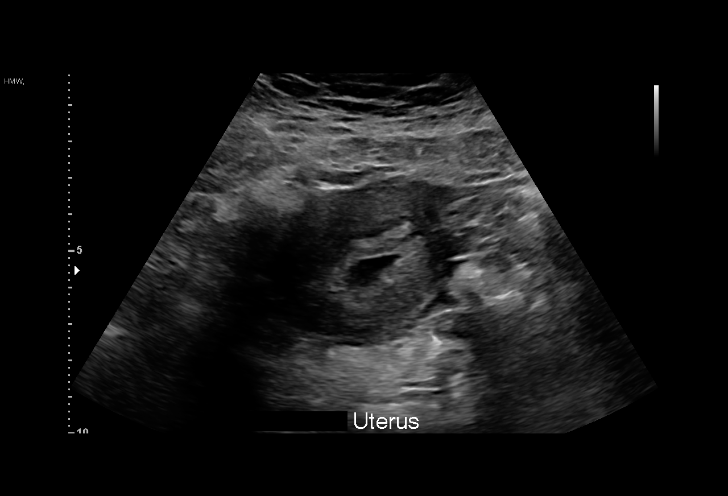
[im 15/49]
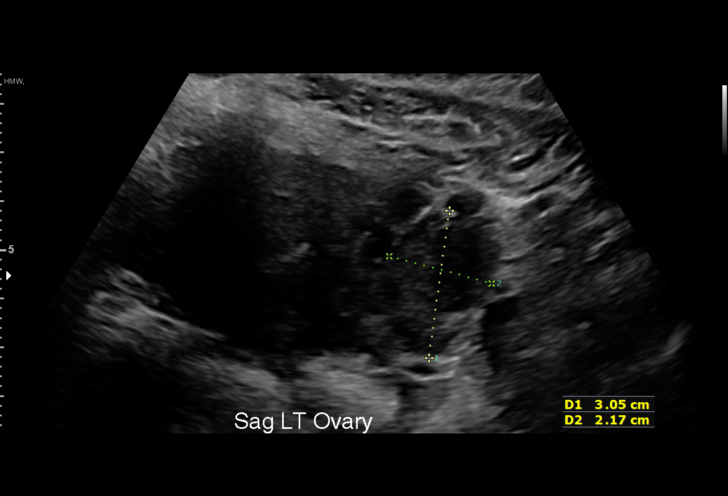
[im 18/49]
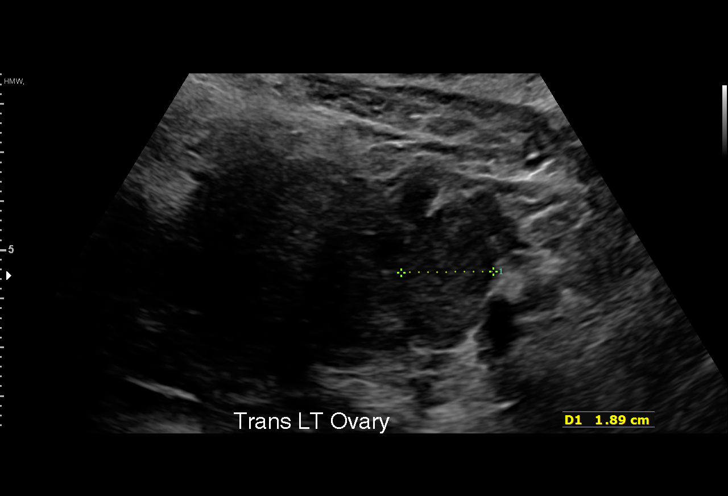
[im 22/49]
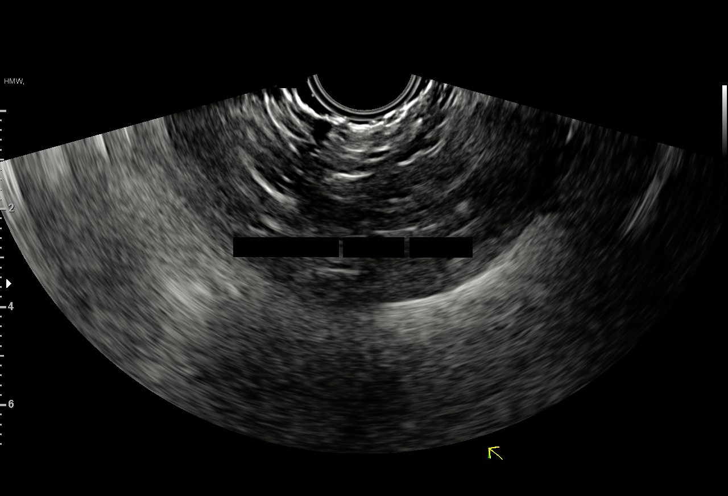
[im 25/49]
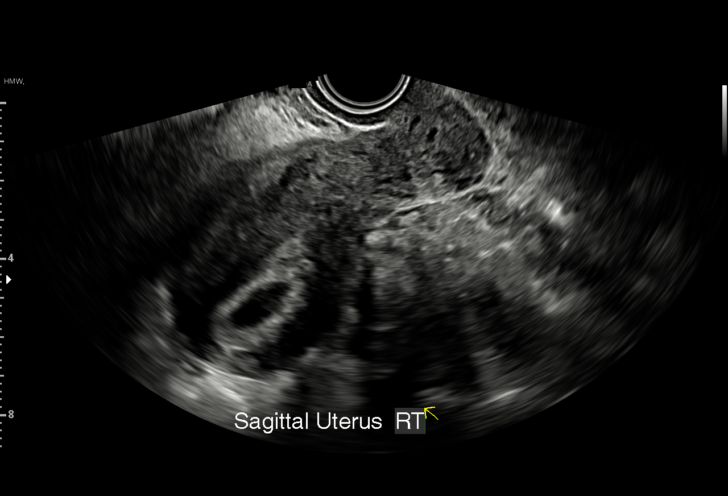
[im 27/49]
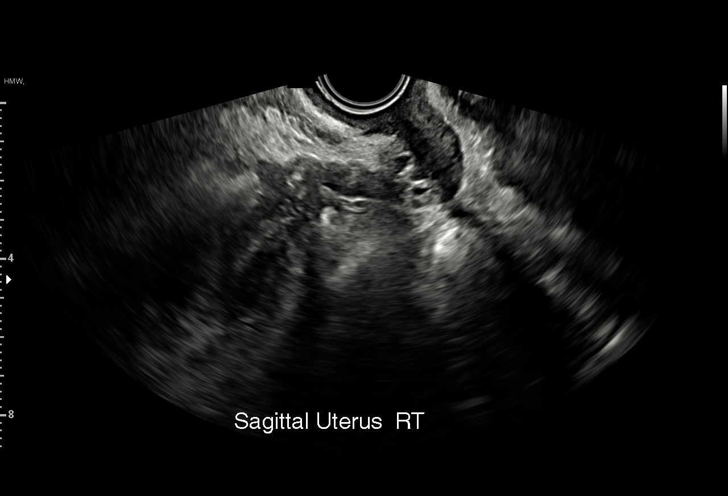
[im 31/49]
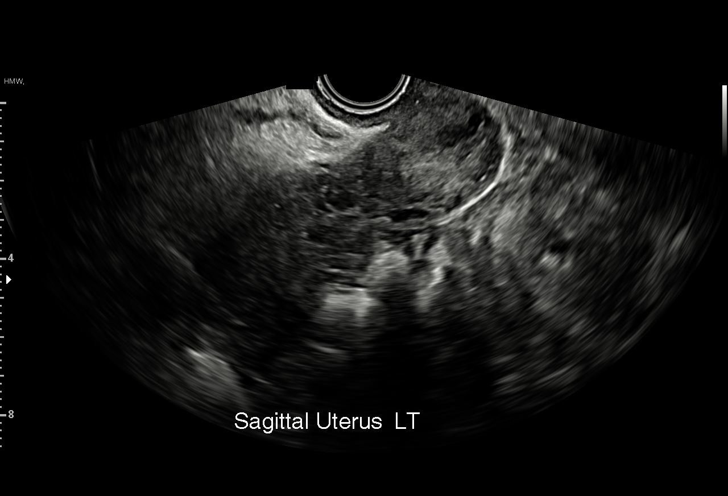
[im 34/49]
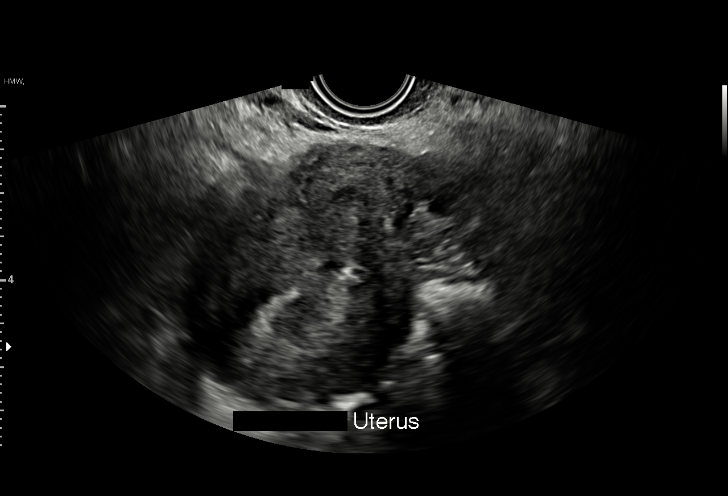
[im 38/49]
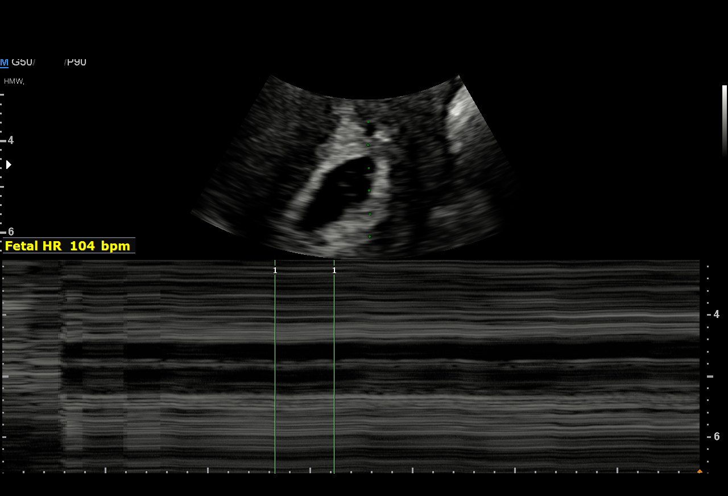
[im 41/49]
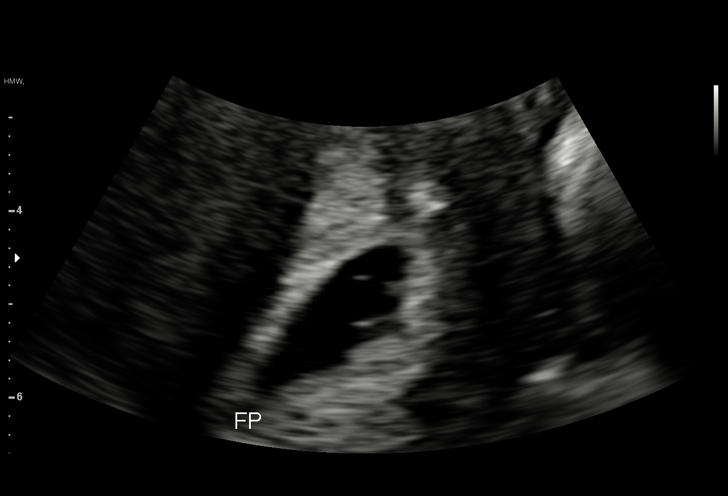
[im 45/49]
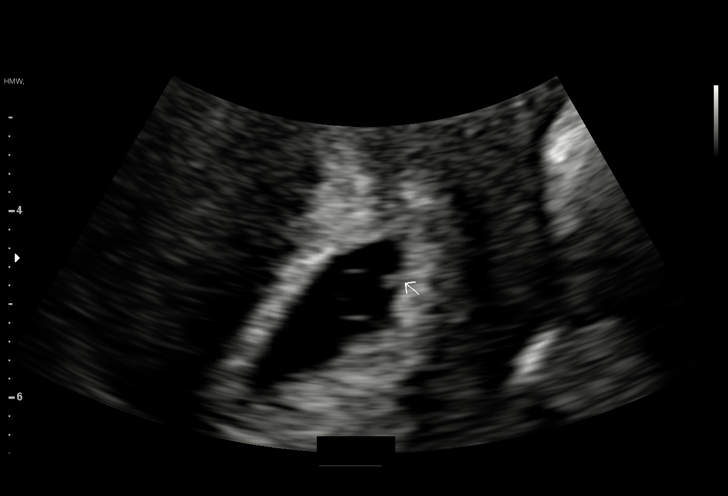
[im 49/49]
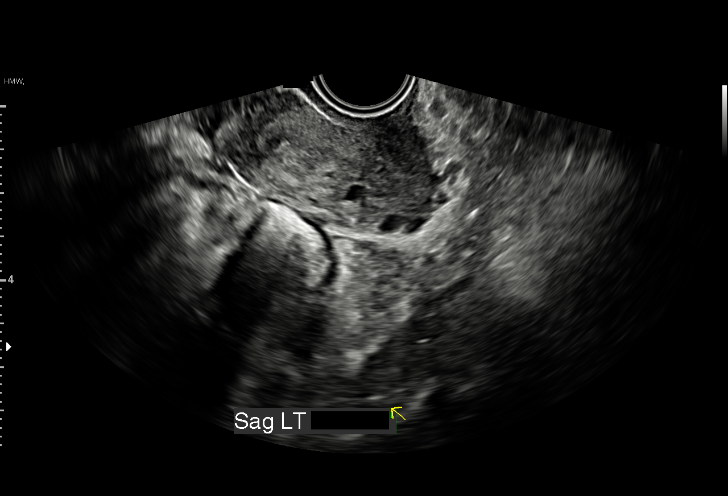

[15 of 28 positions shown; findings below may reference images not displayed]

FINDINGS: Intrauterine gestational sac: Single

Yolk sac:  Visualized.

Embryo:  Visualized.

Cardiac Activity: Visualized.

Heart Rate:   bpm

MSD:   mm    w     d

CRL:  2.2 mm   too small to date

Subchorionic hemorrhage:  None visualized.

Maternal uterus/adnexae: No adnexal mass or free fluid.
IMPRESSION: Early intrauterine gestation with small fetal pole, too small to
date at this time. This could be followed with repeat ultrasound in
14 days to ensure expected progression.

No acute maternal findings.

## 2022-03-01 IMAGING — US US OB TRANSVAGINAL
1 series · 15 of 28 positions shown · non-contrast
Comparison: September 30, 2020.

CLINICAL DATA: Dating. Estimated gestational age of 8 weeks, 4 days
by LMP.

EXAM:
TRANSVAGINAL OB ULTRASOUND
TECHNIQUE: Transvaginal ultrasound was performed for complete evaluation of the
gestation as well as the maternal uterus, adnexal regions, and
pelvic cul-de-sac.

[Series 1: us ob transvaginal · 58 acquisitions, 15 frames shown]
[im 1/58]
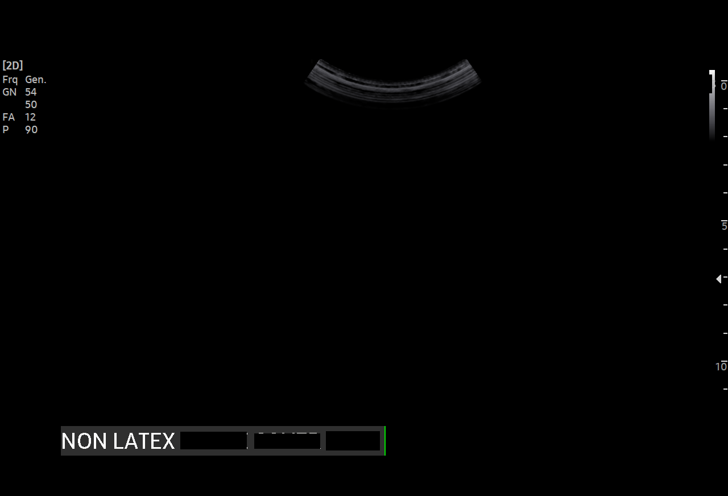
[im 5/58]
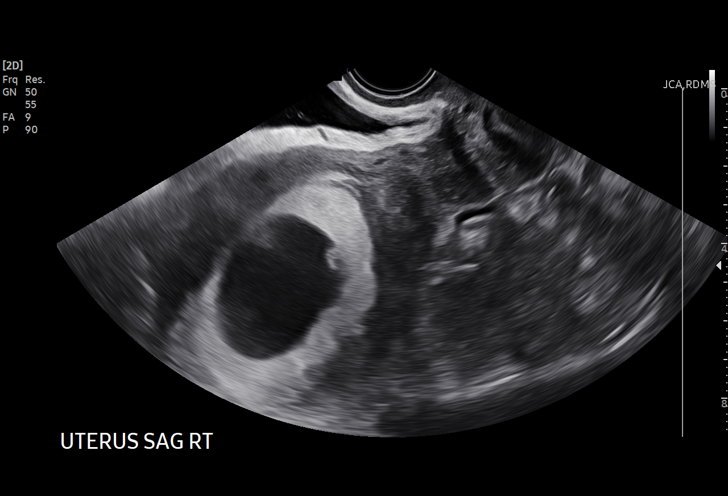
[im 9/58]
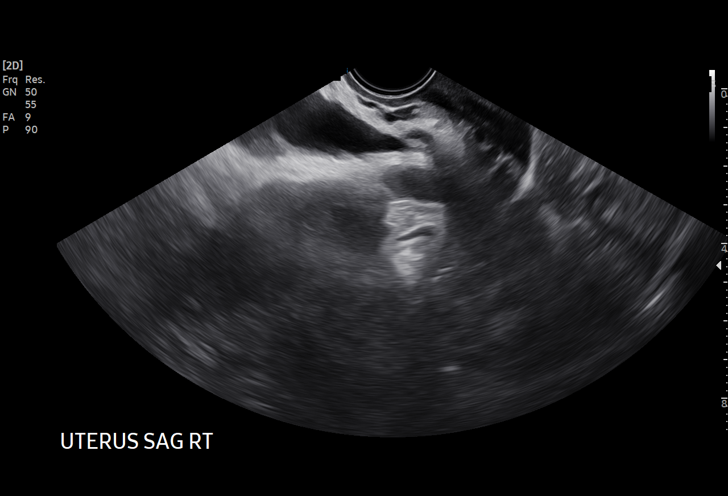
[im 13/58]
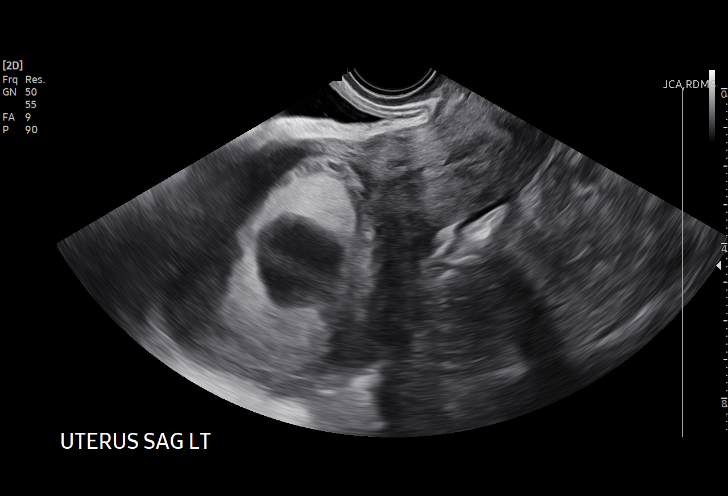
[im 17/58]
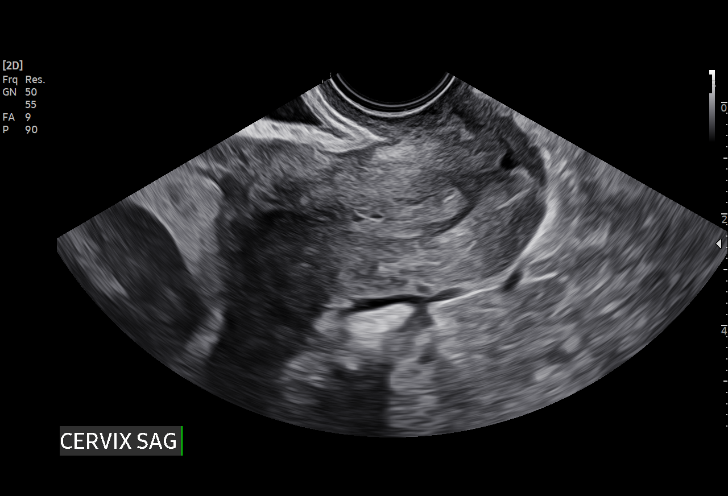
[im 22/58]
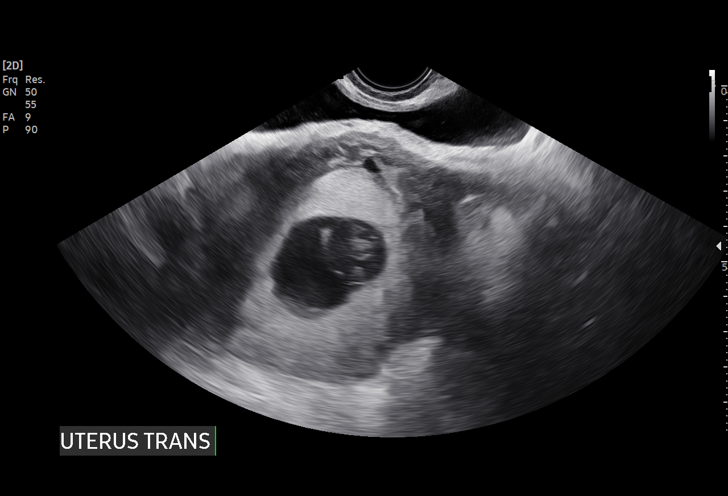
[im 26/58]
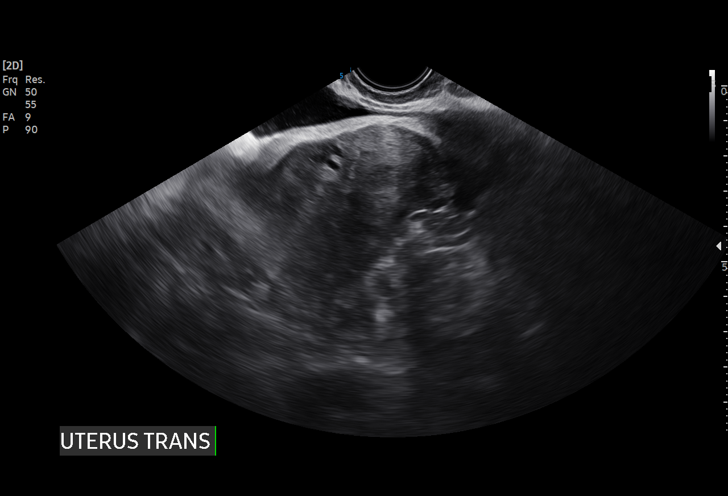
[im 30/58]
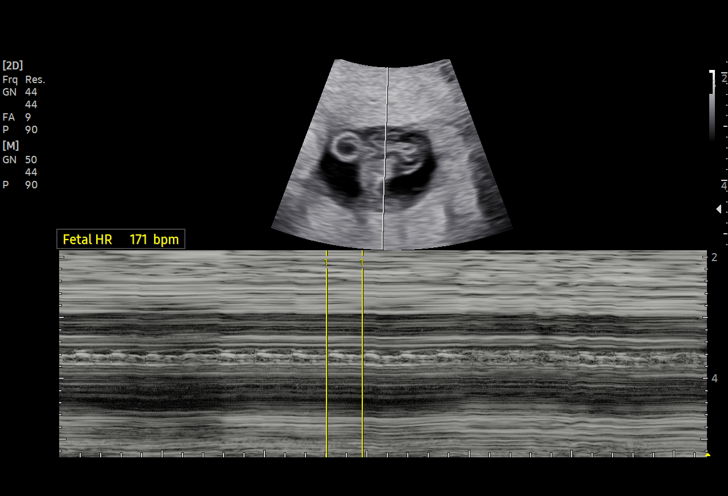
[im 32/58]
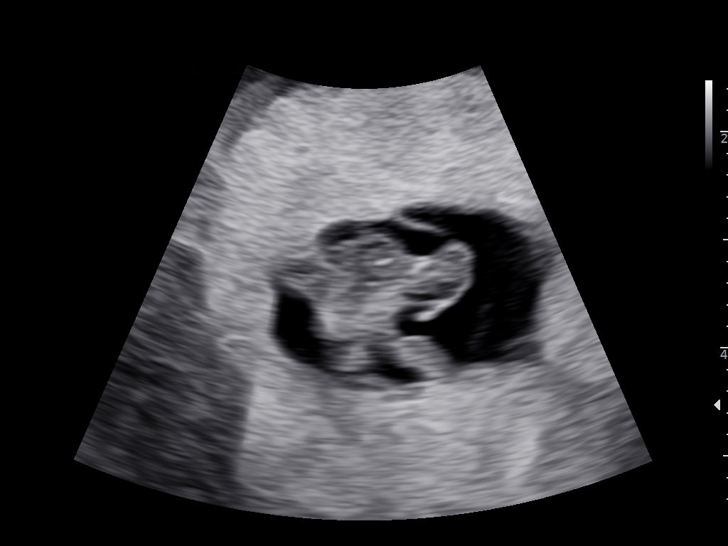
[im 36/58]
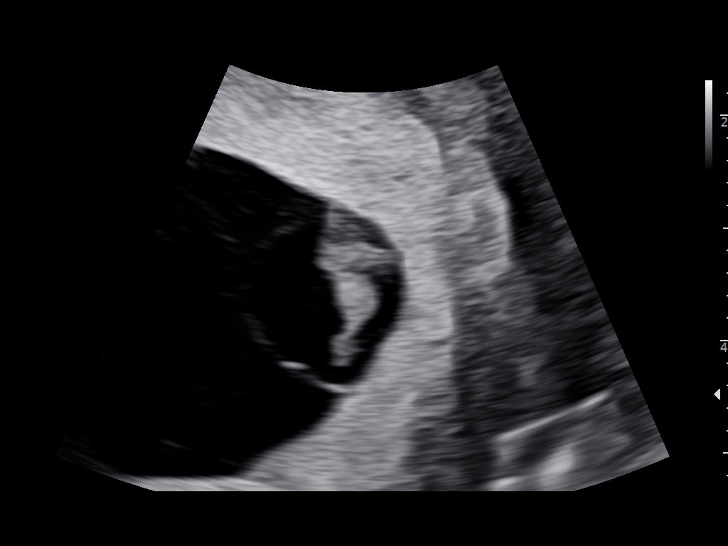
[im 41/58]
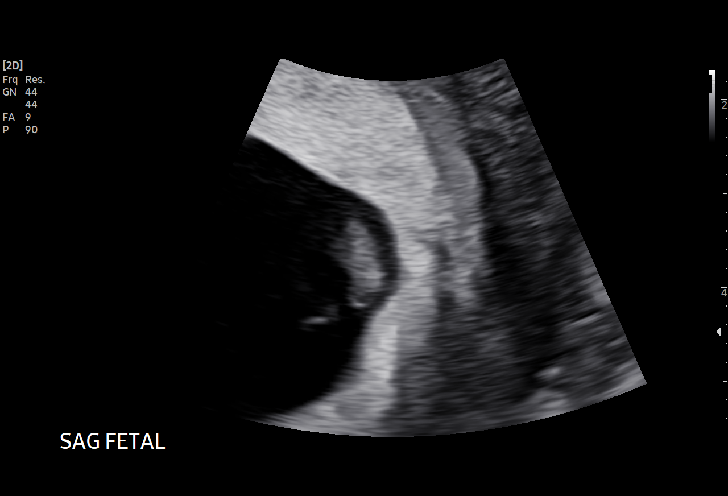
[im 45/58]
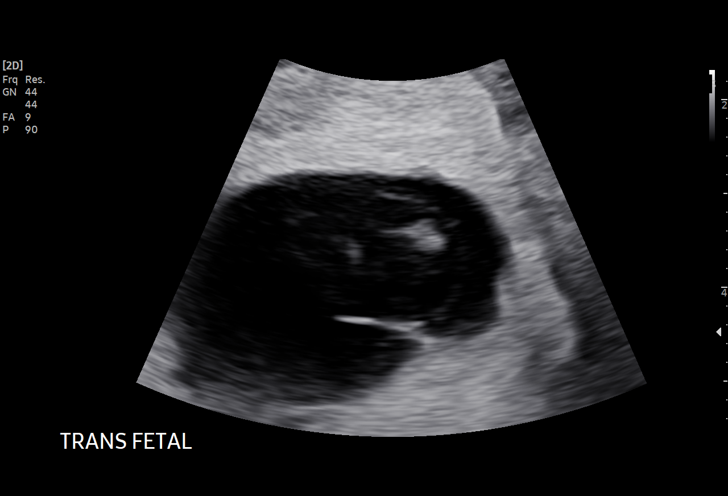
[im 49/58]
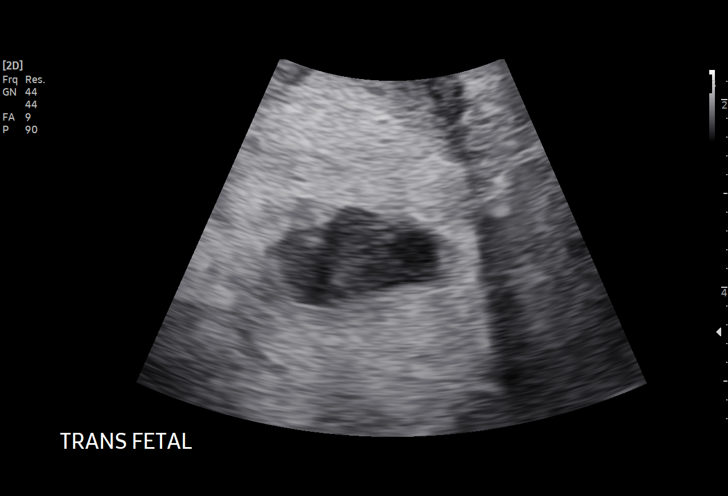
[im 53/58]
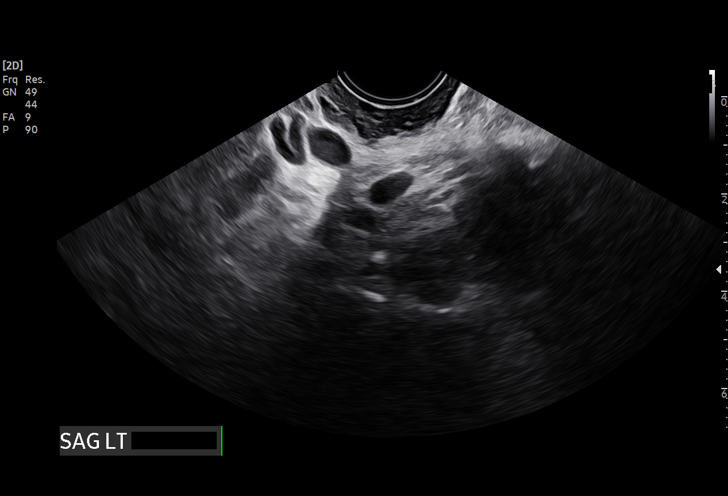
[im 58/58]
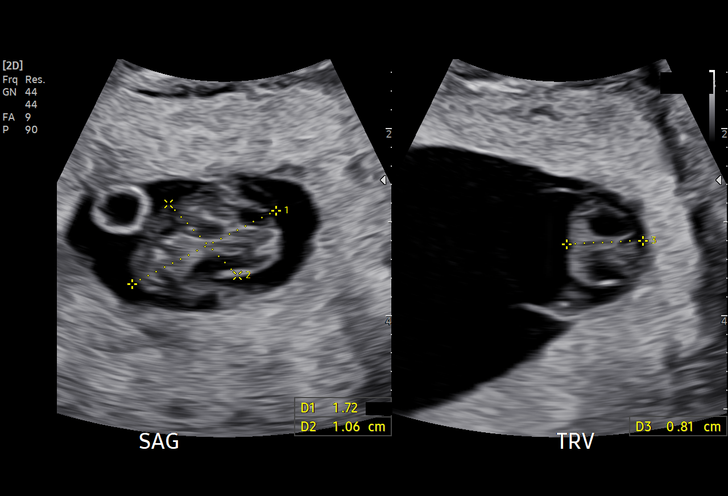

[15 of 28 positions shown; findings below may reference images not displayed]

FINDINGS: Intrauterine gestational sac: Single.

Yolk sac:  Visualized.

Embryo:  Visualized.

Cardiac Activity: Visualized.

Heart Rate: 171 bpm

CRL:   15.1 mm   7 w 6 d                  US EDC: 05/27/2020

Subchorionic hemorrhage:  None visualized.

Maternal uterus/adnexae: The ovaries are not visualized. Trace free
fluid in the pelvis.
IMPRESSION: 1. Single live intrauterine pregnancy with estimated gestational age
of 7 weeks, 6 days. Given mild date discrepancy with the patient's
LMP, follow-up growth scan in 4-6 weeks is recommended.

## 2022-11-23 ENCOUNTER — Ambulatory Visit (INDEPENDENT_AMBULATORY_CARE_PROVIDER_SITE_OTHER): Payer: Medicaid Other

## 2022-11-23 ENCOUNTER — Ambulatory Visit: Payer: Medicaid Other | Admitting: Podiatry

## 2022-11-23 DIAGNOSIS — M778 Other enthesopathies, not elsewhere classified: Secondary | ICD-10-CM

## 2022-11-23 DIAGNOSIS — M722 Plantar fascial fibromatosis: Secondary | ICD-10-CM | POA: Diagnosis not present

## 2022-11-23 DIAGNOSIS — M7751 Other enthesopathy of right foot: Secondary | ICD-10-CM | POA: Diagnosis not present

## 2022-11-23 MED ORDER — MELOXICAM 15 MG PO TABS: 15.0000 mg | ORAL_TABLET | Freq: Every day | ORAL | 3 refills | Status: AC

## 2022-11-23 MED ORDER — METHYLPREDNISOLONE 4 MG PO TBPK: ORAL_TABLET | ORAL | 0 refills | Status: AC

## 2022-11-23 NOTE — Progress Notes (Signed)
  Subjective:  Patient ID: Christine Page, female    DOB: 10/13/1980,  MRN: [id number] HPI Chief Complaint  Patient presents with   Foot Pain    Patient came in today for left foot pain, heel and arch, started 2 months ago, rate of pain 6 out of 10, morning pain, X-Rays done today     42 y.o. female presents with the above complaint.   ROS: Denies fever chills nausea vomit muscle aches pains calf pain back pain chest pain shortness of breath  Past Medical History:  Diagnosis Date   Status post cesarean section 05/20/2021   Thyroid disease    Past Surgical History:  Procedure Laterality Date   CESAREAN SECTION     CESAREAN SECTION N/A 05/20/2021   Procedure: CESAREAN SECTION;  Surgeon: Kathrynn Running, MD;  Location: [facility] LD ORS;  Service: Obstetrics;  Laterality: N/A;    Current Outpatient Medications:    meloxicam (MOBIC) 15 MG tablet, Take 1 tablet (15 mg total) by mouth daily., Disp: 30 tablet, Rfl: 3   methylPREDNISolone (MEDROL DOSEPAK) 4 MG TBPK tablet, 6 day dose pack - take as directed, Disp: 21 tablet, Rfl: 0   acetaminophen (TYLENOL) 500 MG tablet, Take 2 tablets (1,000 mg total) by mouth every 6 (six) hours. (Patient not taking: Reported on 06/19/2021), Disp: 30 tablet, Rfl: 0   ibuprofen (ADVIL) 600 MG tablet, Take 1 tablet (600 mg total) by mouth every 6 (six) hours. (Patient not taking: Reported on 06/19/2021), Disp: 30 tablet, Rfl: 0   levothyroxine (SYNTHROID) 75 MCG tablet, Take 1 tablet (75 mcg total) by mouth daily at 6 (six) AM., Disp: 60 tablet, Rfl: 0  No Known Allergies Review of Systems Objective:  There were no vitals filed for this visit.  General: Well developed, nourished, in no acute distress, alert and oriented x3   Dermatological: Skin is warm, dry and supple bilateral. Nails x 10 are well maintained; remaining integument appears unremarkable at this time. There are no open sores, no preulcerative lesions, no rash or signs of infection  present.  Vascular: Dorsalis Pedis artery and Posterior Tibial artery pedal pulses are 2/4 bilateral with immedate capillary fill time. Pedal hair growth present. No varicosities and no lower extremity edema present bilateral.   Neruologic: Grossly intact via light touch bilateral. Vibratory intact via tuning fork bilateral. Protective threshold with Semmes Wienstein monofilament intact to all pedal sites bilateral. Patellar and Achilles deep tendon reflexes 2+ bilateral. No Babinski or clonus noted bilateral.   Musculoskeletal: No gross boney pedal deformities bilateral. No pain, crepitus, or limitation noted with foot and ankle range of motion bilateral. Muscular strength 5/5 in all groups tested bilateral.  Pain on palpation medial calcaneal tubercle.  Gait: Unassisted, Nonantalgic.    Radiographs:  Radiographs taken today demonstrate mild pes planovalgus with a very mild thickening of the plantar fascia at its insertion site.  Achilles appears to be normal no significant spurring noted.  Assessment & Plan:   Assessment: Planter fasciitis left  Plan: Injected the left heel today 20 mg Kenalog 5 mg and Marcaine discussed appropriate shoe gear stretching exercise ice therapy.  Started her on methylprednisolone to be followed by meloxicam.     Zerrick Hanssen T. [place], [place]

## 2022-12-28 ENCOUNTER — Ambulatory Visit: Payer: Medicaid Other | Admitting: Podiatry

## 2023-09-29 ENCOUNTER — Other Ambulatory Visit: Payer: Self-pay | Admitting: Physician Assistant

## 2023-09-29 DIAGNOSIS — Z1231 Encounter for screening mammogram for malignant neoplasm of breast: Secondary | ICD-10-CM

## 2023-10-13 ENCOUNTER — Ambulatory Visit

## 2023-10-24 ENCOUNTER — Ambulatory Visit
Admission: RE | Admit: 2023-10-24 | Discharge: 2023-10-24 | Disposition: A | Discharge status: Home / Self Care | Source: Ambulatory Visit | Attending: Physician Assistant | Admitting: Physician Assistant

## 2023-10-24 DIAGNOSIS — Z1231 Encounter for screening mammogram for malignant neoplasm of breast: Secondary | ICD-10-CM
